# Patient Record
Sex: Male | Born: 1965 | Race: White | Hispanic: No | Marital: Married | Smoking: Never smoker
Health system: Southern US, Community
[De-identification: ages and names within clinical notes are randomized; demographics above are authoritative.]

## PROBLEM LIST (undated history)

## (undated) DIAGNOSIS — I5189 Other ill-defined heart diseases: Secondary | ICD-10-CM

## (undated) DIAGNOSIS — R079 Chest pain, unspecified: Secondary | ICD-10-CM

## (undated) DIAGNOSIS — M545 Low back pain, unspecified: Secondary | ICD-10-CM

## (undated) DIAGNOSIS — G9050 Complex regional pain syndrome I, unspecified: Secondary | ICD-10-CM

## (undated) DIAGNOSIS — E7800 Pure hypercholesterolemia, unspecified: Secondary | ICD-10-CM

## (undated) DIAGNOSIS — E119 Type 2 diabetes mellitus without complications: Secondary | ICD-10-CM

## (undated) DIAGNOSIS — K760 Fatty (change of) liver, not elsewhere classified: Secondary | ICD-10-CM

## (undated) DIAGNOSIS — R251 Tremor, unspecified: Secondary | ICD-10-CM

## (undated) DIAGNOSIS — E669 Obesity, unspecified: Secondary | ICD-10-CM

## (undated) DIAGNOSIS — I2510 Atherosclerotic heart disease of native coronary artery without angina pectoris: Secondary | ICD-10-CM

## (undated) DIAGNOSIS — I319 Disease of pericardium, unspecified: Secondary | ICD-10-CM

## (undated) DIAGNOSIS — I10 Essential (primary) hypertension: Secondary | ICD-10-CM

## (undated) DIAGNOSIS — R809 Proteinuria, unspecified: Secondary | ICD-10-CM

## (undated) DIAGNOSIS — G4733 Obstructive sleep apnea (adult) (pediatric): Secondary | ICD-10-CM

## (undated) DIAGNOSIS — G8929 Other chronic pain: Secondary | ICD-10-CM

## (undated) DIAGNOSIS — R748 Abnormal levels of other serum enzymes: Secondary | ICD-10-CM

## (undated) DIAGNOSIS — Z9689 Presence of other specified functional implants: Secondary | ICD-10-CM

## (undated) HISTORY — DX: Obstructive sleep apnea (adult) (pediatric): G47.33

## (undated) HISTORY — DX: Low back pain, unspecified: M54.50

## (undated) HISTORY — DX: Tremor, unspecified: R25.1

## (undated) HISTORY — PX: ANKLE SURGERY: SHX546

## (undated) HISTORY — PX: ANKLE ARTHROSCOPY: SHX545

## (undated) HISTORY — DX: Chest pain, unspecified: R07.9

## (undated) HISTORY — DX: Disease of pericardium, unspecified: I31.9

## (undated) HISTORY — PX: ELBOW SURGERY: SHX618

## (undated) HISTORY — DX: Abnormal levels of other serum enzymes: R74.8

## (undated) HISTORY — DX: Other ill-defined heart diseases: I51.89

## (undated) HISTORY — DX: Pure hypercholesterolemia, unspecified: E78.00

## (undated) HISTORY — DX: Fatty (change of) liver, not elsewhere classified: K76.0

## (undated) HISTORY — DX: Proteinuria, unspecified: R80.9

## (undated) HISTORY — DX: Low back pain: M54.5

## (undated) HISTORY — DX: Obesity, unspecified: E66.9

## (undated) HISTORY — PX: OTHER SURGICAL HISTORY: SHX169

## (undated) HISTORY — DX: Atherosclerotic heart disease of native coronary artery without angina pectoris: I25.10

## (undated) HISTORY — DX: Type 2 diabetes mellitus without complications: E11.9

---

## 1999-08-30 ENCOUNTER — Ambulatory Visit (HOSPITAL_COMMUNITY): Admission: RE | Admit: 1999-08-30 | Discharge: 1999-08-30 | Payer: Self-pay | Admitting: Urology

## 1999-08-30 ENCOUNTER — Encounter: Payer: Self-pay | Admitting: Urology

## 2001-05-30 ENCOUNTER — Encounter: Payer: Self-pay | Admitting: Emergency Medicine

## 2001-05-30 ENCOUNTER — Emergency Department (HOSPITAL_COMMUNITY): Admission: EM | Admit: 2001-05-30 | Discharge: 2001-05-30 | Payer: Self-pay | Admitting: Emergency Medicine

## 2005-05-09 ENCOUNTER — Encounter: Admission: RE | Admit: 2005-05-09 | Discharge: 2005-05-09 | Payer: Self-pay | Admitting: General Surgery

## 2005-08-20 ENCOUNTER — Encounter: Admission: RE | Admit: 2005-08-20 | Discharge: 2005-08-20 | Payer: Self-pay | Admitting: Family Medicine

## 2006-11-24 DIAGNOSIS — I2510 Atherosclerotic heart disease of native coronary artery without angina pectoris: Secondary | ICD-10-CM

## 2006-11-24 DIAGNOSIS — I319 Disease of pericardium, unspecified: Secondary | ICD-10-CM

## 2006-11-24 HISTORY — DX: Atherosclerotic heart disease of native coronary artery without angina pectoris: I25.10

## 2006-11-24 HISTORY — DX: Disease of pericardium, unspecified: I31.9

## 2007-03-02 ENCOUNTER — Encounter: Admission: RE | Admit: 2007-03-02 | Discharge: 2007-03-02 | Payer: Self-pay | Admitting: Family Medicine

## 2007-03-21 ENCOUNTER — Inpatient Hospital Stay (HOSPITAL_COMMUNITY): Admission: EM | Admit: 2007-03-21 | Discharge: 2007-03-23 | Payer: Self-pay | Admitting: *Deleted

## 2007-03-22 ENCOUNTER — Encounter (INDEPENDENT_AMBULATORY_CARE_PROVIDER_SITE_OTHER): Payer: Self-pay | Admitting: Cardiology

## 2007-03-24 ENCOUNTER — Inpatient Hospital Stay (HOSPITAL_COMMUNITY): Admission: EM | Admit: 2007-03-24 | Discharge: 2007-03-26 | Payer: Self-pay | Admitting: *Deleted

## 2007-03-31 ENCOUNTER — Emergency Department (HOSPITAL_COMMUNITY): Admission: EM | Admit: 2007-03-31 | Discharge: 2007-03-31 | Payer: Self-pay | Admitting: Emergency Medicine

## 2007-09-11 IMAGING — CR DG CHEST 2V
2 series · 2 of 2 positions shown · non-contrast
Comparison: 03/21/07.

CLINICAL DATA: Chest pain.   Diabetes. 
 CHEST - 2 VIEW:

[w chest pa]
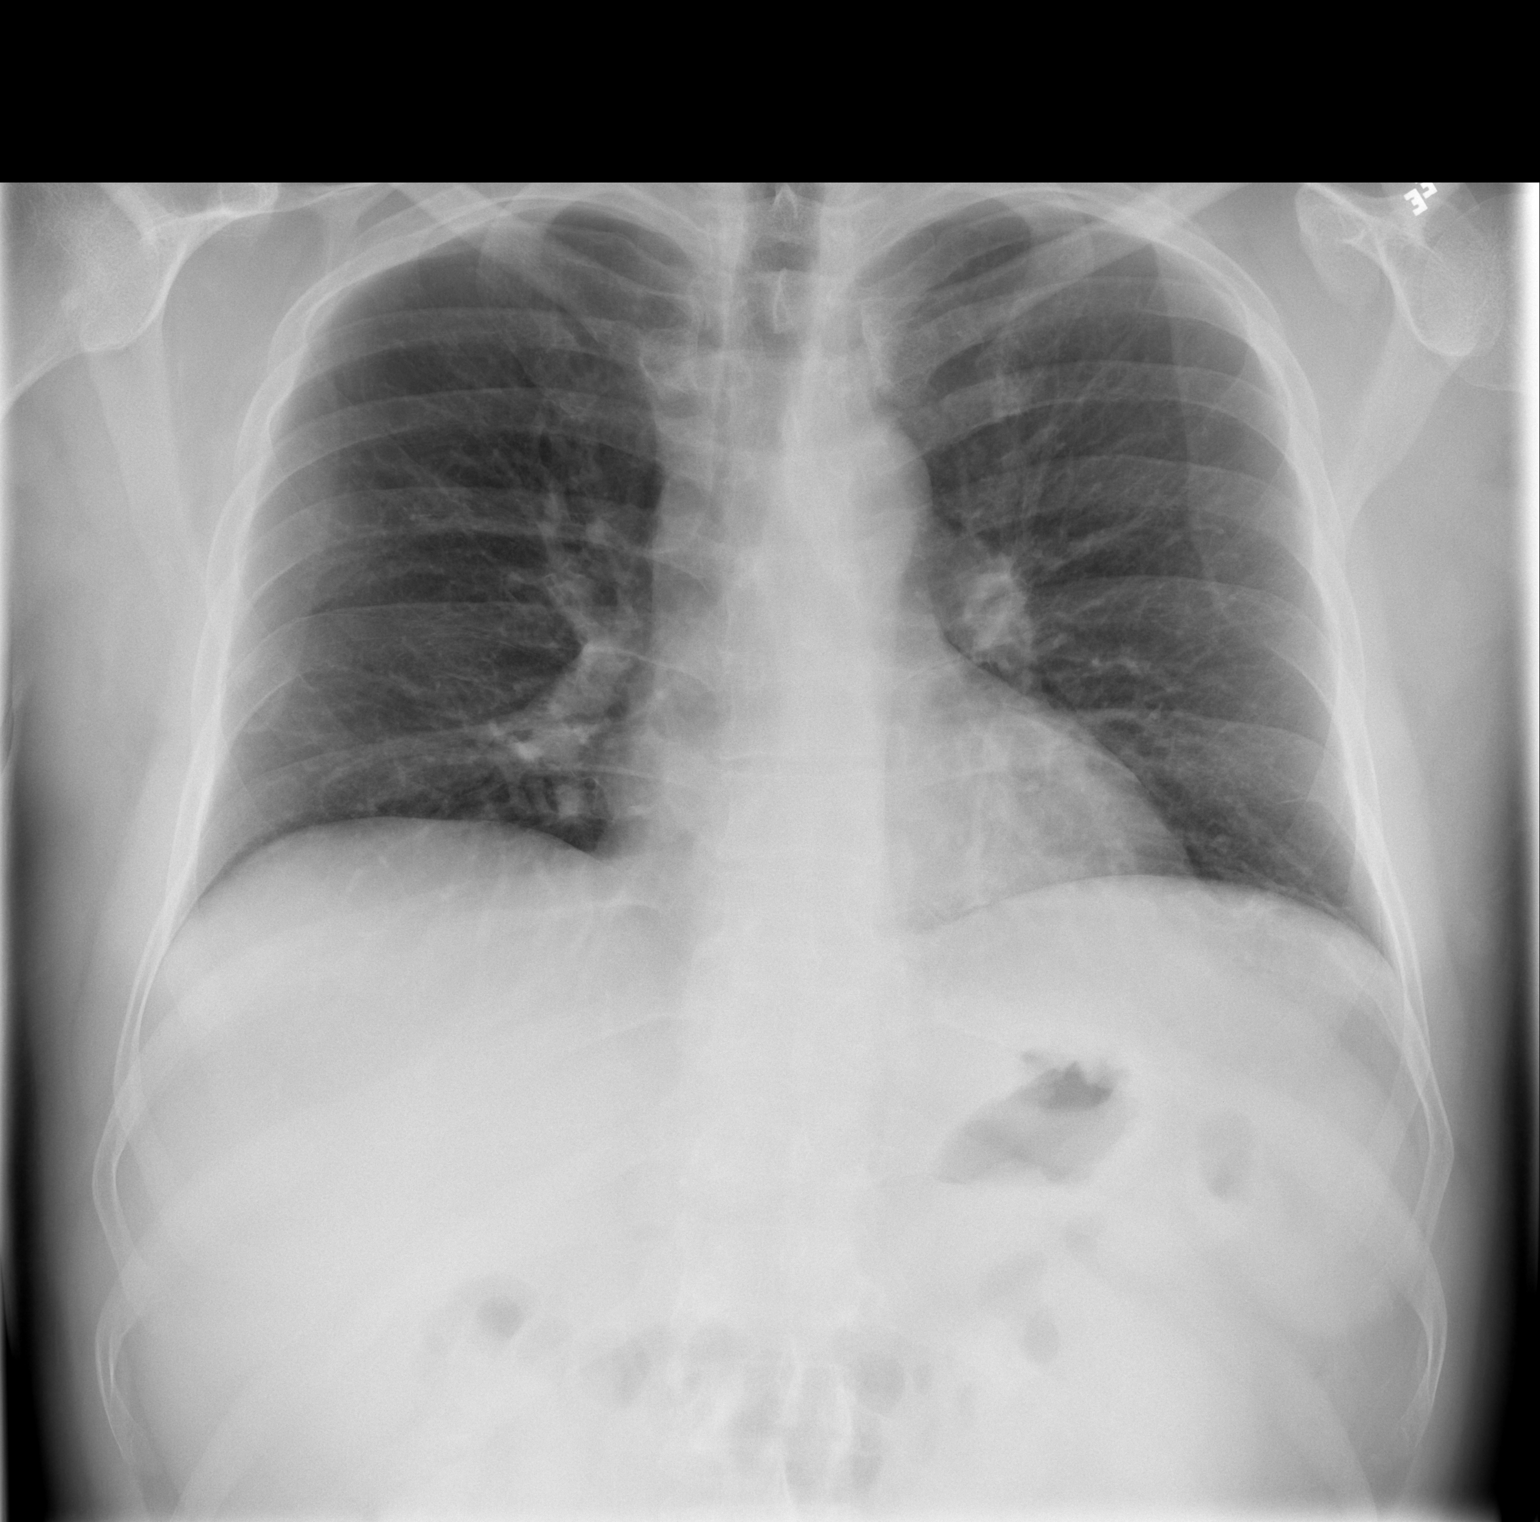

[w chest lat *]
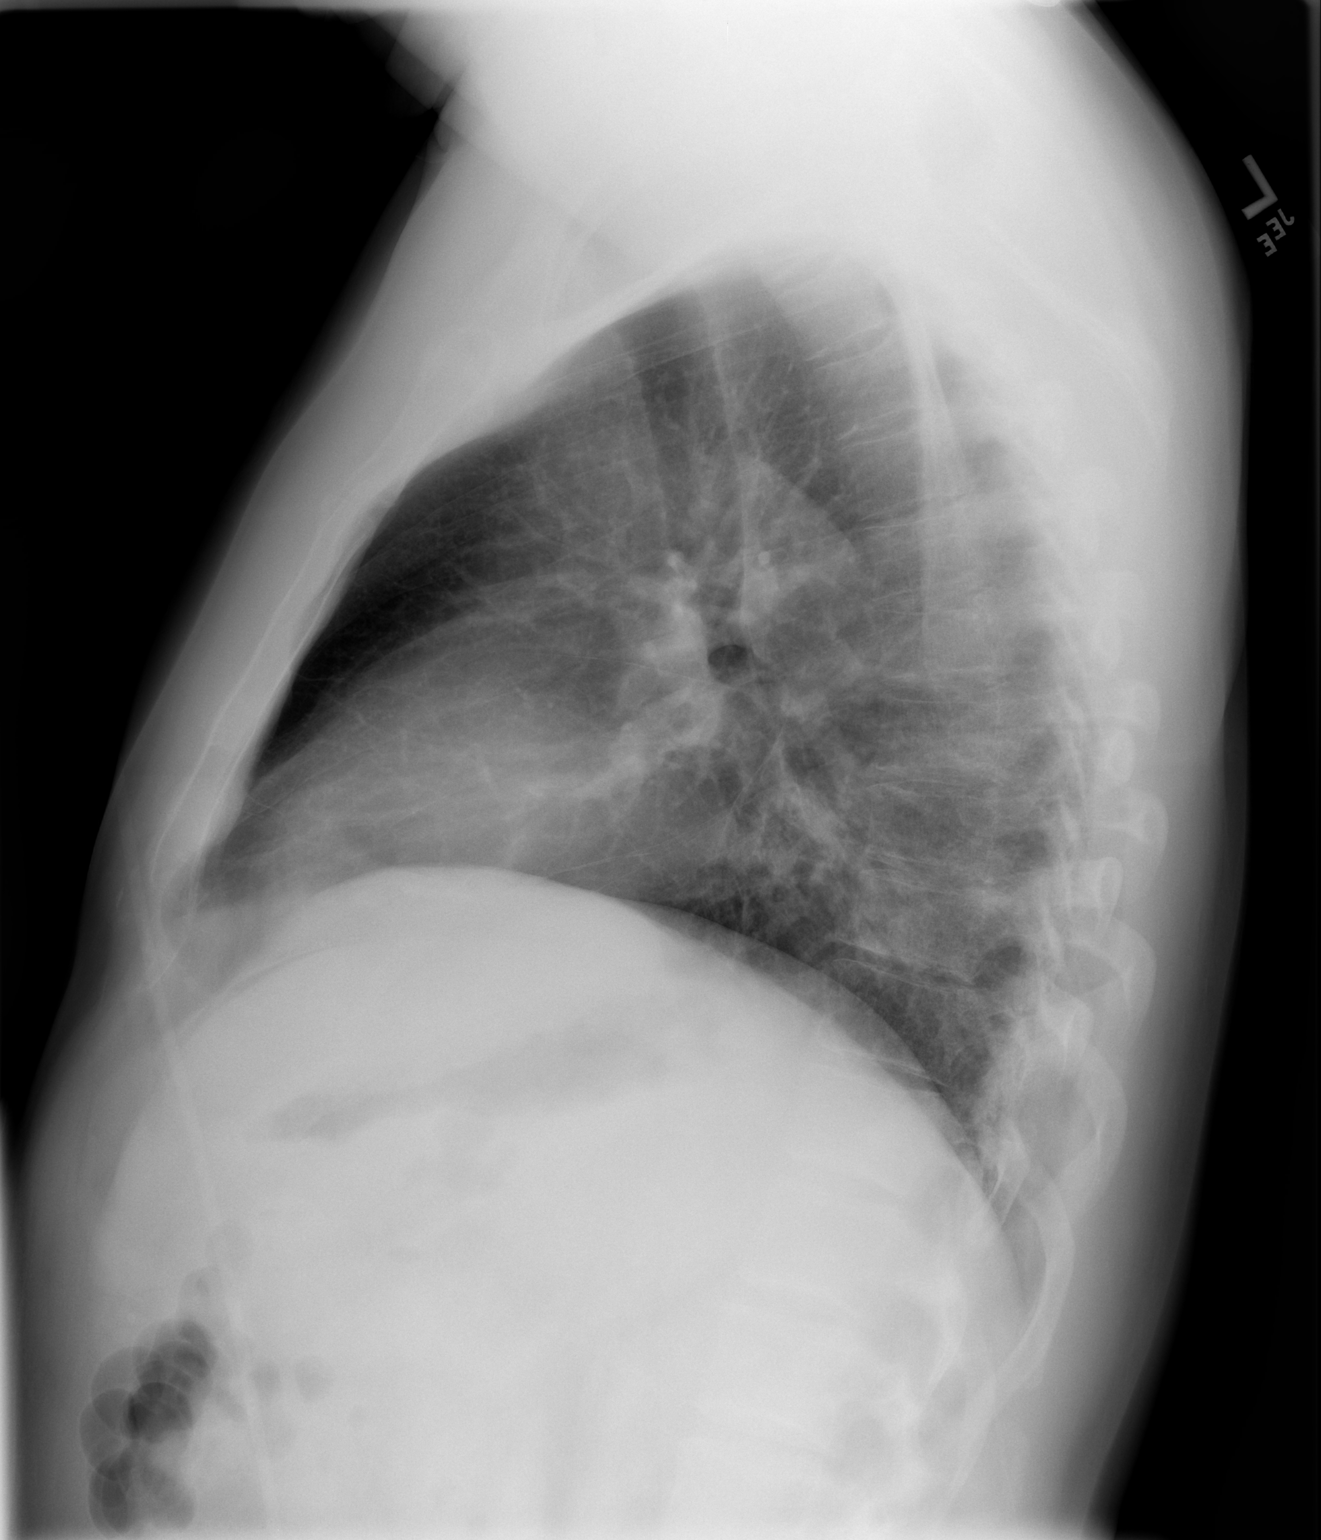

[2 of 2 positions shown; findings below may reference images not displayed]

FINDINGS: Two views of the chest show the lung to be clear.  The heart is within normal limits in size.  No bony abnormality is seen.
IMPRESSION: No active lung disease.

## 2010-11-15 ENCOUNTER — Encounter
Admission: RE | Admit: 2010-11-15 | Discharge: 2010-11-15 | Payer: Self-pay | Source: Home / Self Care | Attending: Family Medicine | Admitting: Family Medicine

## 2011-04-11 NOTE — H&P (Signed)
NAME:  INRI, SOBIESKI NO.:  [id number]   MEDICAL RECORD NO.:  [id number]          PATIENT TYPE:  INP   LOCATION:  3703                         FACILITY:  [facility]   PHYSICIAN:  Guy Franco, P.A.       DATE OF BIRTH:  1966-05-27   DATE OF ADMISSION:  03/21/2007  DATE OF DISCHARGE:  03/23/2007                              HISTORY & PHYSICAL   DATE OF ADMISSION:  March 24, 2007.   REASON FOR ADMISSION:  Chest pain.   Mr. Rajewski is a 45 year old male patient, who was discharged from the  hospital yesterday with chest pain.  His cardiac isoenzymes were  negative, and he was scheduled for a Cardiolite tomorrow at our office.  Over the past three months, he has noted intermittent chest pain, and  over the past three weeks it has worsened.  On Sunday, his pain became  unbearable and he went to the emergency room.  During his  hospitalization, enzymes were negative, and he also had a two-D echo  that was normal.  He was sent home yesterday and then overnight he began  having diaphoretic spells, nausea, vomiting, and chest pain with deep  breathing.  He states that the chest pain is always present but worse  with deep breathing.  Please note on his two-D echo that his pericardium  was normal and there was no evidence of effusion.   PAST MEDICAL HISTORY:  1. Hypertension.  2. Hyperlipidemia.  3. Long term medication use.   SOCIAL HISTORY:  No tobacco, alcohol, or illicit drug use.  He is  married.   FAMILY HISTORY:  Dad had hypertension and an MI and died in his 71s.   CURRENT MEDICATIONS:  1. Cardizem 120 mg a day.  2. Baby aspirin daily.  3. Protonix 40 mg a day.  4. Zocor 20 mg a day.   ALLERGIES:  No known drug allergies.   PHYSICAL EXAMINATION:  VITAL SIGNS:  Weight 206.6, height 5 foot 9  inches, pulse 92, blood pressure 158/92.  HEENT:  Grossly normal, normal carotid upstroke, no carotid bruits, no  JVD or thyromegaly, sclerae are clear, conjunctivae are  normal, nares  without drainage.  CHEST:  Clear to auscultation bilaterally, no wheezing or rhonchi.  HEART:  Regular rate and rhythm, no gross murmur, there is no rub, there  are no crackles, there is no evidence of pericardial effusion.  ABDOMEN:  Soft, nontender, nondistended, and no masses.  LOWER EXTREMITIES:  No peripheral edema.  SKIN:  Warm and dry.  NEURO:  Cranial nerves II-XII grossly intact.  He does appear to be  sick, pale.   REVIEW OF SYSTEMS:  As above.  Please note that he also had an episode  while he was sitting on the bed today.  He was getting ready to stand up  and he said he passed out for a couple of seconds; it was not witnessed.   ASSESSMENT AND PLAN:  1. Chest pain.  2. Questionable syncopal spell.  3. Hypertension.  4. Hyperlipidemia.  5. Long term medication use.   The patient was seen  and examined by Dr. Verdis Prime.  He will need to  have a coronary CT angiogram today.  We are giving him Lopressor to slow  down his heart rate, and because of his chest pain worsening with deep  breathing, we are generally concerned with a pericarditis.  We are  giving him some Decadron when he arrives at the emergency room and then  we will repeat it again in four hours.  He will remain NPO in the event  that he does need to go to the cardiac catheterization lab following the  CT angio.      Guy Franco, P.A.     LB/MEDQ  D:  03/24/2007  T:  03/24/2007  Job:  [id number]

## 2011-04-11 NOTE — Discharge Summary (Signed)
NAME:  Vincent Vargas, Vincent Vargas NO.:  [id number]   MEDICAL RECORD NO.:  [id number]          PATIENT TYPE:  INP   LOCATION:  3743                         FACILITY:  [facility]   PHYSICIAN:  Armanda Magic, M.D.     DATE OF BIRTH:  October 30, 1966   DATE OF ADMISSION:  03/24/2007  DATE OF DISCHARGE:                               DISCHARGE SUMMARY   Cardiologist:  Armanda Magic, M.D.   REASON FOR ADMISSION:  Chest pain.   DISCHARGE DIAGNOSES:  1. Chest pain, resolved.  Myocardial infarction ruled out with      negative cardiac enzymes x3.  2. Hypertension.  3. Dyslipidemia.  4. Acid reflux, newly diagnosed during a recent hospital admission on      March 22, 2007.   CONSULTATIONS:  None.   PROCEDURES:  March 24, 2007, coronary CT angiography revealing normal LV  size and function as well as normal coronary arteries.  No significant  pericardial effusion.  No extracardiac abnormalities noted.  The  suspicion was serositis or possibly pericarditis as the patient had  already shown improvement on IV Decadron.   HOSPITAL COURSE:  Vincent Vargas is a 45 year old Caucasian male who was  recently discharged from the hospital on March 23, 2007, secondary to  chest pains.  MI was ruled out with negative serial cardiac enzymes x3.  The patient's chest pain was believed to be secondary to GERD and he was  started on Protonix 40 mg daily upon discharge as well as aspirin 81 mg  daily.  He was scheduled for an outpatient stress Cardiolite at the  [facility] office in [place], [place], on Mar 25, 2007,  however was readmitted to the hospital on March 24, 2007, with a chief  complaint of chest pain.  A 12-lead EKG revealed normal sinus rhythm  with an incomplete right bundle branch block with a ventricular rate of  87 beats per minute.  There was no evidence of acute ischemic changes.  Serial cardiac enzymes were negative x3 with a peak troponin of 0.03,  BNP was within normal  limits as well as TSH.  Coronary CT angiography  revealed normal coronary arteries with normal LV size and systolic  function.  The patient was suspected to have serositis or pericarditis  as he had already shown improvement with IV Decadron x1.  The patient  continued to complain of chest pain during the admission that was  relived with IV morphine.  Liver function tests were minimally elevated,  so an amylase and lipase was checked and were within normal limits x2.  The patient's Protonix was increased to 40 mg twice daily during this  admission.  Also during this admission the patient was started on  Lopressor 25 mg twice daily.  He was then prescribed ibuprofen 800 mg  every 6 hours secondary to possible costochondritis.  On Mar 26, 2007, he  was given IV Toradol 30 mg every 8 hours as needed.  IV morphine and IV  nitroglycerin were discontinued.  IV saline was locked.  The patient  continued to complain of chest pain and requested a cardiac  catheterization.  However, due to the fact that the patient had normal  coronary arteries via CT coronary angiography and the increased risk of  catheterization, Dr. Armanda Magic did not see a need for proceeding with  a cardiac catheterization so the patient requested to be transferred to  [facility] for further evaluation and possible left heart  catheterization.  He is being discharged to [facility] in stable  condition.  CT coronary angiography was negative for PE or dissection as  well.  He was seen and examined by Dr. Armanda Magic on today, who was in  agreement with the discharge decision.  A 12-lead EKG on today revealed  sinus bradycardia with a ventricular rate of 58 beats per minute with an  incomplete right bundle branch block.  There is no evidence of acute  ischemic changes.   LABORATORY DATA:  Sodium 135, potassium 4.3, chloride 102, CO2 28,  glucose 124, BUN 15, creatinine 0.95.  Total bilirubin 0.6,k alkaline   phosphatase 71, AST 23, ALT 58, total protein 6.7, serum albumin 3.7,  calcium 9.3.  Amylase 54 and 40, respectively.  Lipase 30 and 25,  respectively.  Serial cardiac enzymes:  CK total 164, 100, and 92.  CK-  MB 1.6, 1.2, and 1.2.  Troponin I 0.03 and 0.02 x2.  BNP less than 30.0.  TSH 2.590.  Antinuclear antibody negative.  White blood count 6.8,  hemoglobin 14.7, hematocrit 42.9, platelets 268,000.   X-RAYS:  None during this admission.  CT coronary angiography revealed  no acute findings.  Basically normal coronary arteries.  Soft,  nonobstructive plaque is noted in the mid right coronary.  Normal LV  function with borderline LV hypertrophy.  Extracardiac findings were  interpreted by Vincent Vargas, M.D., and revealed no acute findings.  March 24, 2007.   EKGs as stated in hospital course.   CONDITION ON DISCHARGE:  Stable.   DISCHARGE MEDICATIONS:  1. Aspirin 325 mg daily.  2. Zocor 20 mg daily.  3. Lopressor 25 mg twice daily.  4. Ibuprofen 800 mg four times daily for 7 days.  5. Protonix 40 mg twice daily.   DISCHARGE INSTRUCTIONS:  1. Discontinue diltiazem 240 mg daily.  2. Continue a low-fat, low-cholesterol, low-sodium diet.   FOLLOW-UP ARRANGEMENTS:  Follow up at [facility] as needed.  The  patient is being transferred to [facility] via [facility] in stable  condition.  The patient has been explained the increased risks of  cardiac catheterization in a patient with normal coronary arteries.  However, he admits to understanding of the information but still wishes  to proceed.   Discharge to [facility].      Tylene Fantasia, [place]      Armanda Magic, M.D.  Electronically Signed    RDM/MEDQ  D:  03/26/2007  T:  03/26/2007  Job:  [id number]

## 2011-04-11 NOTE — H&P (Signed)
NAME:  Vincent Vargas, Vincent Vargas             ACCOUNT NO.:  [id number]   MEDICAL RECORD NO.:  [id number]          PATIENT TYPE:  INP   LOCATION:  3703                         FACILITY:  [facility]   PHYSICIAN:  Kela Millin, M.D.DATE OF BIRTH:  October 22, 1966   DATE OF ADMISSION:  03/21/2007  DATE OF DISCHARGE:                              HISTORY & PHYSICAL   PRIMARY CARE PHYSICIAN:  Dr. Donia Guiles.   CHIEF COMPLAINT:  Chest pain.   HISTORY OF PRESENT ILLNESS:  The patient is a 45 year old white male  with past medical history significant for hypertension and  hyperlipidemia (on no medications) who presents with complaints of chest  pain over several weeks with worsening x1 day.  He describes the pain as  a pressure, 8/10 in intensity, nonexertional, midsternal in location,  and sometimes worse with inspiration.  He admits to nausea but no  vomiting, some shortness of breath.  He denies diaphoresis, also denies  radiation.  In the ER, he was given nitroglycerin but he states he did  not have much relief with that.  He denies cough, fevers, hematemesis,  abdominal pain, dysuria, melena, diarrhea, and no hematochezia.   In the ER, he had point-of-care markers that are negative and also his  EKG with no ischemic changes.  He is admitted to the [facility]  service for further evaluation and management.   PAST MEDICAL HISTORY:  As stated above.   MEDICATIONS:  Diltiazem 120 mg daily.   ALLERGIES:  NKDA.   SOCIAL HISTORY:  He denies tobacco.  He also denies alcohol.   FAMILY HISTORY:  His dad had hypertension and died in his 4s of an MI.   REVIEW OF SYSTEMS:  As per HPI, other review of systems negative.   PHYSICAL EXAMINATION:  GENERAL:  The patient is a middle-aged white  male.  He is alert and oriented in no apparent distress.  VITAL SIGNS:  His temperature is 98.1 with a blood pressure of 123/94,  pulse of 63, respiratory rate of 16, O2 saturation of 98%.  HEENT:  PERRL,  EOMI, sclerae are anicteric, moist mucous membranes, no  oral exudates.  NECK:  Supple, no adenopathy, no thyromegaly and no JVD.  LUNGS:  Clear to auscultation bilaterally, no crackles or wheezes.  CARDIOVASCULAR:  Regular rate and rhythm, normal S1, S2.  ABDOMEN:  Soft, bowel sounds present, nontender, nondistended, no  organomegaly and no masses palpable.  EXTREMITIES:  No cyanosis and no edema.   LABORATORY DATA:  Point-of-care markers negative.  His sodium is 138,  potassium 4.0, chloride 106, BUN 9, glucose 107, pH is 7.40, his bicarb  is 25.7 and his white cell count 5.9 with a hemoglobin of 14.3,  hematocrit of 41.7, platelet count 281, neutrophil count 50%.  His  creatinine is 0.9.   ASSESSMENT AND PLAN:  Problem 1. CHEST PAIN.  We will obtain serial  cardiac enzymes and EKG also obtained and fasting lipid profile.  We  will place the patient on aspirin and nitroglycerin.  We will also place  the patient on Protonix to cover for possible gastrointestinal etiology.  Per patient report, he is scheduled for a stress test at 9:30 in the  morning per Dr. Amil Amen, we will call and consult, also notify of the  patient's admission.   Problem 2. HYPERTENSION.  Continue outpatient medications.   Problem 3. QUESTION HYPERLIPIDEMIA.  We will obtain a fasting lipid  profile.      Kela Millin, M.D.  Electronically Signed     ACV/MEDQ  D:  03/22/2007  T:  03/22/2007  Job:  [id number]   cc:   Donia Guiles, M.D.  Francisca December, M.D.

## 2011-08-14 ENCOUNTER — Ambulatory Visit (HOSPITAL_COMMUNITY)
Admission: RE | Admit: 2011-08-14 | Discharge: 2011-08-14 | Disposition: A | Discharge status: Home / Self Care | Payer: 59 | Source: Ambulatory Visit | Attending: Gastroenterology | Admitting: Gastroenterology

## 2011-08-14 ENCOUNTER — Other Ambulatory Visit: Payer: Self-pay | Admitting: Gastroenterology

## 2011-08-14 DIAGNOSIS — K920 Hematemesis: Secondary | ICD-10-CM | POA: Insufficient documentation

## 2011-08-14 DIAGNOSIS — E119 Type 2 diabetes mellitus without complications: Secondary | ICD-10-CM | POA: Insufficient documentation

## 2011-08-14 DIAGNOSIS — K2940 Chronic atrophic gastritis without bleeding: Secondary | ICD-10-CM | POA: Insufficient documentation

## 2011-08-14 DIAGNOSIS — R7401 Elevation of levels of liver transaminase levels: Secondary | ICD-10-CM | POA: Insufficient documentation

## 2011-08-14 DIAGNOSIS — R7402 Elevation of levels of lactic acid dehydrogenase (LDH): Secondary | ICD-10-CM | POA: Insufficient documentation

## 2011-08-14 DIAGNOSIS — K7689 Other specified diseases of liver: Secondary | ICD-10-CM | POA: Insufficient documentation

## 2011-08-24 NOTE — Op Note (Signed)
  NAME:  [name], [name] NO.:  [id number]  MEDICAL RECORD NO.:  [id number]  LOCATION:  WLEN                         FACILITY:  [facility]  PHYSICIAN:  Danise Edge, M.D.   DATE OF BIRTH:  [birth year]  DATE OF PROCEDURE:  08/14/2011 DATE OF DISCHARGE:                              OPERATIVE REPORT   REFERRING PHYSICIAN:  Lupita Raider, M.D.  PROCEDURE:  Diagnostic esophagogastroduodenoscopy.  HISTORY:  Mr. Vincent Vargas is a 45 year old male, born on November 20, 1966.  The patient has unexplained nausea with intermittent hematemesis.  He denies abdominal pain, history of peptic ulcer disease, use of nonsteroidal anti-inflammatory medication, heartburn, indigestion, dysphagia, or odynophagia.  He does have type 2 diabetes mellitus and mildly elevated liver transaminases associated with fatty liver by abdominal ultrasound.  ENDOSCOPIST:  Danise Edge, M.D.  PREMEDICATION: 1. Fentanyl 75 mcg. 2. Versed 5 mg.  PROCEDURE IN DETAIL:  The patient was placed in the left lateral decubitus position.  The Pentax gastroscope was passed through the posterior hypopharynx into the proximal esophagus without difficulty. The hypopharynx, larynx, and vocal cords appeared normal.  Esophagoscopy:  The proximal mid and lower segments of the esophageal mucosa appeared normal.  The squamocolumnar junction was noted at 40 cm from the incisor teeth.  There was no endoscopic evidence for the presence of erosive esophagitis or Barrett esophagus.  Gastroscopy:  Retroflex view of the gastric, cardia, and fundus was normal.  The gastric body, antrum, and pylorus appeared normal.  Duodenoscopy:  The duodenal bulb and descending duodenum appeared normal.  Biopsies:  Multiple biopsies were taken from the stomach to rule out H. pylori gastritis.  ASSESSMENT:  Normal esophagogastroduodenoscopy with gastric biopsies to look for Helicobacter pylori gastritis pending.  The  patient's esophagogastroduodenoscopy was normal.  I suspect his episodes of nausea are associated with his diabetes and possible diabetic gastroparesis.          ______________________________ Danise Edge, M.D.     MJ/MEDQ  D:  08/14/2011  T:  08/14/2011  Job:  [id number]  cc:   Lupita Raider, M.D. Fax: [phone]  Electronically Signed by Danise Edge M.D. on 08/24/2011 01:44:18 PM

## 2012-03-12 ENCOUNTER — Emergency Department (HOSPITAL_BASED_OUTPATIENT_CLINIC_OR_DEPARTMENT_OTHER)
Admission: EM | Admit: 2012-03-12 | Discharge: 2012-03-12 | Disposition: A | Discharge status: Home / Self Care | Payer: 59 | Attending: Emergency Medicine | Admitting: Emergency Medicine

## 2012-03-12 ENCOUNTER — Emergency Department (INDEPENDENT_AMBULATORY_CARE_PROVIDER_SITE_OTHER): Payer: 59

## 2012-03-12 ENCOUNTER — Encounter (HOSPITAL_BASED_OUTPATIENT_CLINIC_OR_DEPARTMENT_OTHER): Payer: Self-pay | Admitting: *Deleted

## 2012-03-12 DIAGNOSIS — Z87442 Personal history of urinary calculi: Secondary | ICD-10-CM | POA: Insufficient documentation

## 2012-03-12 DIAGNOSIS — K7689 Other specified diseases of liver: Secondary | ICD-10-CM

## 2012-03-12 DIAGNOSIS — N201 Calculus of ureter: Secondary | ICD-10-CM

## 2012-03-12 DIAGNOSIS — I708 Atherosclerosis of other arteries: Secondary | ICD-10-CM

## 2012-03-12 DIAGNOSIS — R1032 Left lower quadrant pain: Secondary | ICD-10-CM | POA: Insufficient documentation

## 2012-03-12 DIAGNOSIS — N1330 Unspecified hydronephrosis: Secondary | ICD-10-CM

## 2012-03-12 DIAGNOSIS — I10 Essential (primary) hypertension: Secondary | ICD-10-CM | POA: Insufficient documentation

## 2012-03-12 DIAGNOSIS — N23 Unspecified renal colic: Secondary | ICD-10-CM

## 2012-03-12 DIAGNOSIS — R112 Nausea with vomiting, unspecified: Secondary | ICD-10-CM | POA: Insufficient documentation

## 2012-03-12 HISTORY — DX: Essential (primary) hypertension: I10

## 2012-03-12 LAB — URINALYSIS, ROUTINE W REFLEX MICROSCOPIC
Glucose, UA: 500 mg/dL — AB
Leukocytes, UA: NEGATIVE
pH: 5 (ref 5.0–8.0)

## 2012-03-12 LAB — URINE MICROSCOPIC-ADD ON

## 2012-03-12 MED ORDER — HYDROMORPHONE HCL 2 MG PO TABS: 2.0000 mg | ORAL_TABLET | ORAL | Status: AC | PRN

## 2012-03-12 MED ORDER — KETOROLAC TROMETHAMINE 15 MG/ML IJ SOLN
15.0000 mg | Freq: Once | INTRAMUSCULAR | Status: AC
  Administered 2012-03-12: 15 mg via INTRAVENOUS
  Filled 2012-03-12: qty 1

## 2012-03-12 MED ORDER — ONDANSETRON 8 MG PO TBDP: 8.0000 mg | ORAL_TABLET | Freq: Three times a day (TID) | ORAL | Status: AC | PRN

## 2012-03-12 MED ORDER — HYDROMORPHONE HCL PF 1 MG/ML IJ SOLN
1.0000 mg | Freq: Once | INTRAMUSCULAR | Status: AC
  Administered 2012-03-12: 1 mg via INTRAVENOUS

## 2012-03-12 MED ORDER — NAPROXEN SODIUM 220 MG PO TABS: ORAL_TABLET | ORAL | Status: DC

## 2012-03-12 MED ORDER — ONDANSETRON HCL 4 MG/2ML IJ SOLN
4.0000 mg | Freq: Once | INTRAMUSCULAR | Status: AC
  Administered 2012-03-12: 4 mg via INTRAVENOUS

## 2012-03-12 MED ORDER — HYDROMORPHONE HCL PF 1 MG/ML IJ SOLN
INTRAMUSCULAR | Status: AC
  Administered 2012-03-12: 1 mg via INTRAVENOUS
  Filled 2012-03-12: qty 1

## 2012-03-12 MED ORDER — HYDROMORPHONE HCL PF 1 MG/ML IJ SOLN
INTRAMUSCULAR | Status: AC
  Filled 2012-03-12: qty 1

## 2012-03-12 MED ORDER — ONDANSETRON HCL 4 MG/2ML IJ SOLN
INTRAMUSCULAR | Status: AC
  Filled 2012-03-12: qty 2

## 2012-03-12 MED ORDER — SODIUM CHLORIDE 0.9 % IV SOLN: INTRAVENOUS | Status: DC

## 2012-03-12 NOTE — Discharge Instructions (Signed)
Ureteral Colic (Kidney Stones) Ureteral colic is the result of a condition when kidney stones form inside the kidney. Once kidney stones are formed they may move into the tube that connects the kidney with the bladder (ureter). If this occurs, this condition may cause pain (colic) in the ureter.  CAUSES  Pain is caused by stone movement in the ureter and the obstruction caused by the stone. SYMPTOMS  The pain comes and goes as the ureter contracts around the stone. The pain is usually intense, sharp, and stabbing in character. The location of the pain may move as the stone moves through the ureter. When the stone is near the kidney the pain is usually located in the back and radiates to the belly (abdomen). When the stone is ready to pass into the bladder the pain is often located in the lower abdomen on the side the stone is located. At this location, the symptoms may mimic those of a urinary tract infection with urinary frequency. Once the stone is located here it often passes into the bladder and the pain disappears completely. TREATMENT   Your caregiver will provide you with medicine for pain relief.   You may require specialized follow-up X-rays.   The absence of pain does not always mean that the stone has passed. It may have just stopped moving. If the urine remains completely obstructed, it can cause loss of kidney function or even complete destruction of the involved kidney. It is your responsibility and in your interest that X-rays and follow-ups as suggested by your caregiver are completed. Relief of pain without passage of the stone can be associated with severe damage to the kidney, including loss of kidney function on that side.   If your stone does not pass on its own, additional measures may be taken by your caregiver to ensure its removal.  HOME CARE INSTRUCTIONS   Increase your fluid intake. Water is the preferred fluid since juices containing vitamin C may acidify the urine making  it less likely for certain stones (uric acid stones) to pass.   Strain all urine. A strainer will be provided. Keep all particulate matter or stones for your caregiver to inspect.   Take your pain medicine as directed.   Make a follow-up appointment with your caregiver as directed.   Remember that the goal is passage of your stone. The absence of pain does not mean the stone is gone. Follow your caregiver's instructions.   Only take over-the-counter or prescription medicines for pain, discomfort, or fever as directed by your caregiver.  SEEK MEDICAL CARE IF:   Pain cannot be controlled with the prescribed medicine.   You have a fever.   Pain continues for longer than your caregiver advises it should.   There is a change in the pain, and you develop chest discomfort or constant abdominal pain.   You feel faint or pass out.  MAKE SURE YOU:   Understand these instructions.   Will watch your condition.   Will get help right away if you are not doing well or get worse.  Document Released: 08/20/2005 Document Revised: 10/30/2011 Document Reviewed: 05/07/2011 [employer] Patient Information 2012 [employer]. 

## 2012-03-12 NOTE — ED Notes (Signed)
Pt with left flank pain and nausea sx began this am has a hx of kidney stones and sx are similar to previous stone

## 2012-03-12 NOTE — ED Provider Notes (Signed)
History     CSN: [id number]  Arrival date & time 03/12/12  0358   First MD Initiated Contact with Patient 03/12/12 0405      Chief Complaint  Patient presents with  . Flank Pain    (Consider location/radiation/quality/duration/timing/severity/associated sxs/prior treatment) HPI This is a 46 year old white male with a history of kidney stones. He awoke about 2 hours ago with severe left lower quadrant pain. It is characterized as similar to prior kidney stone. It is accompanied by nausea and vomiting. He has had no dysuria, hematuria or diarrhea. He has had no fevers or chills.  Past Medical History  Diagnosis Date  . Hypertension     History reviewed. No pertinent past surgical history.  History reviewed. No pertinent family history.  History  Substance Use Topics  . Smoking status: Never Smoker   . Smokeless tobacco: Not on file  . Alcohol Use: No      Review of Systems  All other systems reviewed and are negative.    Allergies  Review of patient's allergies indicates no known allergies.  Home Medications   Current Outpatient Rx  Name Route Sig Dispense Refill  . UNKNOWN TO PATIENT        BP 152/86  Pulse 76  Temp(Src) 98.1 F (36.7 C) (Oral)  Resp 16  SpO2 98%  Physical Exam General: Well-developed, well-nourished male in no acute distress; appearance consistent with age of record; appears uncomfortable HENT: normocephalic, atraumatic Eyes: pupils equal round and reactive to light; extraocular muscles intact Neck: supple Heart: regular rate and rhythm Lungs: clear to auscultation bilaterally Abdomen: soft; nondistended; mild left lower quadrant tenderness; bowel sounds present GU: No flank tenderness Extremities: No deformity; full range of motion Neurologic: Awake, alert and oriented; motor function intact in all extremities and symmetric; no facial droop Skin: Warm and dry Psychiatric: Anxious    ED Course  Procedures (including critical  care time)    MDM   Nursing notes and vitals signs, including pulse oximetry, reviewed.  Summary of this visit's results, reviewed by myself:  Labs:  Results for orders placed during the hospital encounter of 03/12/12  URINALYSIS, ROUTINE W REFLEX MICROSCOPIC      Component Value Range   Color, Urine AMBER (*) YELLOW    APPearance CLOUDY (*) CLEAR    Specific Gravity, Urine 1.026  1.005 - 1.030    pH 5.0  5.0 - 8.0    Glucose, UA 500 (*) NEGATIVE (mg/dL)   Hgb urine dipstick SMALL (*) NEGATIVE    Bilirubin Urine NEGATIVE  NEGATIVE    Ketones, ur 15 (*) NEGATIVE (mg/dL)   Protein, ur 30 (*) NEGATIVE (mg/dL)   Urobilinogen, UA 0.2  0.0 - 1.0 (mg/dL)   Nitrite NEGATIVE  NEGATIVE    Leukocytes, UA NEGATIVE  NEGATIVE   URINE MICROSCOPIC-ADD ON      Component Value Range   Squamous Epithelial / LPF RARE  RARE    WBC, UA 0-2  <3 (WBC/hpf)   RBC / HPF 3-6  <3 (RBC/hpf)   Urine-Other MUCOUS PRESENT      Imaging Studies: Ct Abdomen Pelvis Wo Contrast  03/12/2012  *RADIOLOGY REPORT*  Clinical Data: Left flank pain.  History of renal stone.  CT ABDOMEN AND PELVIS WITHOUT CONTRAST  Technique:  Multidetector CT imaging of the abdomen and pelvis was performed following the standard protocol without intravenous contrast.  Comparison: Abdominal ultrasound performed 11/15/2010  Findings: The visualized lung bases are clear.  Diffuse fatty infiltration is  noted within the liver.  An 8 mm hypodensity at the inferior tip of the liver is nonspecific but may reflect a small cyst.  The liver is otherwise grossly unremarkable in appearance.  The gallbladder is within normal limits.  The pancreas and adrenal glands are unremarkable.  There is minimal left-sided hydronephrosis, with prominence of the left ureter and mild surrounding soft tissue stranding, to the level of a distal obstructing 3 mm stone at the left vesicoureteral junction.  The right kidney is unremarkable in appearance.  No nonobstructing  renal stones are identified.  No free fluid is identified.  The small bowel is unremarkable in appearance.  The stomach is within normal limits.  No acute vascular abnormalities are seen.  Minimal calcification is noted along the right common iliac artery.  The appendix is normal in caliber and contains air, without evidence for appendicitis.  Minimal diverticulosis is noted along the proximal sigmoid colon, without evidence of diverticulitis. The colon is otherwise unremarkable in appearance.  The bladder is decompressed and otherwise grossly unremarkable in appearance.  The prostate is normal in size.  No inguinal lymphadenopathy is seen.  No acute osseous abnormalities are identified.  Chronic bilateral pars defects are seen at L5, without evidence of significant anterolisthesis.  IMPRESSION:  1.  Minimal left-sided hydronephrosis, with an obstructing distal 3 mm stone noted, at the left vesicoureteral junction. 2.  Diffuse fatty infiltration within the liver; question of tiny hepatic cyst. 3.  Minimal calcification along the right common iliac artery. 4.  Chronic bilateral pars defects at L5, without evidence of anterolisthesis.  Original Report Authenticated By: Tonia Ghent, M.D.            Hanley Seamen, MD 03/12/12 [phone]

## 2012-09-03 ENCOUNTER — Other Ambulatory Visit: Payer: Self-pay | Admitting: Gastroenterology

## 2012-09-09 ENCOUNTER — Ambulatory Visit
Admission: RE | Admit: 2012-09-09 | Discharge: 2012-09-09 | Disposition: A | Discharge status: Home / Self Care | Payer: 59 | Source: Ambulatory Visit | Attending: Gastroenterology | Admitting: Gastroenterology

## 2013-10-07 ENCOUNTER — Ambulatory Visit: Payer: 59 | Admitting: Cardiology

## 2013-10-09 ENCOUNTER — Encounter: Payer: Self-pay | Admitting: Cardiology

## 2013-10-09 ENCOUNTER — Encounter: Payer: Self-pay | Admitting: *Deleted

## 2013-10-09 DIAGNOSIS — E669 Obesity, unspecified: Secondary | ICD-10-CM | POA: Insufficient documentation

## 2013-10-09 DIAGNOSIS — R748 Abnormal levels of other serum enzymes: Secondary | ICD-10-CM | POA: Insufficient documentation

## 2013-10-09 DIAGNOSIS — M545 Low back pain: Secondary | ICD-10-CM | POA: Insufficient documentation

## 2013-10-09 DIAGNOSIS — R079 Chest pain, unspecified: Secondary | ICD-10-CM | POA: Insufficient documentation

## 2013-10-09 DIAGNOSIS — E119 Type 2 diabetes mellitus without complications: Secondary | ICD-10-CM | POA: Insufficient documentation

## 2013-10-09 DIAGNOSIS — I5189 Other ill-defined heart diseases: Secondary | ICD-10-CM | POA: Insufficient documentation

## 2013-10-09 DIAGNOSIS — R809 Proteinuria, unspecified: Secondary | ICD-10-CM | POA: Insufficient documentation

## 2013-10-09 DIAGNOSIS — E7800 Pure hypercholesterolemia, unspecified: Secondary | ICD-10-CM | POA: Insufficient documentation

## 2013-10-09 DIAGNOSIS — K760 Fatty (change of) liver, not elsewhere classified: Secondary | ICD-10-CM | POA: Insufficient documentation

## 2013-10-09 DIAGNOSIS — I10 Essential (primary) hypertension: Secondary | ICD-10-CM | POA: Insufficient documentation

## 2013-10-09 DIAGNOSIS — R251 Tremor, unspecified: Secondary | ICD-10-CM | POA: Insufficient documentation

## 2013-10-10 ENCOUNTER — Ambulatory Visit: Payer: 59 | Admitting: Cardiology

## 2017-06-30 ENCOUNTER — Encounter: Payer: Self-pay | Admitting: *Deleted

## 2017-11-24 HISTORY — PX: SPINAL CORD STIMULATOR IMPLANT: SHX2422

## 2019-06-20 ENCOUNTER — Ambulatory Visit: Payer: Self-pay | Admitting: Allergy

## 2019-06-22 ENCOUNTER — Ambulatory Visit: Payer: Self-pay | Admitting: Allergy

## 2019-06-27 ENCOUNTER — Other Ambulatory Visit: Payer: Self-pay

## 2019-06-27 ENCOUNTER — Ambulatory Visit (INDEPENDENT_AMBULATORY_CARE_PROVIDER_SITE_OTHER): Payer: Worker's Compensation | Admitting: Allergy

## 2019-06-27 ENCOUNTER — Encounter: Payer: Self-pay | Admitting: Allergy

## 2019-06-27 VITALS — BP 138/100 | HR 92 | Temp 97.0°F | Resp 16 | Ht 69.0 in | Wt 181.2 lb

## 2019-06-27 DIAGNOSIS — L299 Pruritus, unspecified: Secondary | ICD-10-CM | POA: Diagnosis not present

## 2019-06-27 NOTE — Patient Instructions (Addendum)
Get bloodwork Start proper skin care as below  May use over the counter antihistamines such as Zyrtec (cetirizine), Claritin (loratadine), Allegra (fexofenadine), or Xyzal (levocetirizine) daily as needed.  Return in 2 weeks for possible patch testing   Skin care recommendations  Bath time: . Always use lukewarm water. AVOID very hot or cold water. Marland Kitchen Keep bathing time to 5-10 minutes. . Do NOT use bubble bath. . Use a mild soap and use just enough to wash the dirty areas. . Do NOT scrub skin vigorously.  . After bathing, pat dry your skin with a towel. Do NOT rub or scrub the skin.  Moisturizers and prescriptions:  . ALWAYS apply moisturizers immediately after bathing (within 3 minutes). This helps to lock-in moisture. . Use the moisturizer several times a day over the whole body. Kermit Balo summer moisturizers include: Aveeno, CeraVe, Cetaphil. Kermit Balo winter moisturizers include: Aquaphor, Vaseline, Cerave, Cetaphil, Eucerin, Vanicream. . When using moisturizers along with medications, the moisturizer should be applied about one hour after applying the medication to prevent diluting effect of the medication or moisturize around where you applied the medications. When not using medications, the moisturizer can be continued twice daily as maintenance.  Laundry and clothing: . Avoid laundry products with added color or perfumes. . Use unscented hypo-allergenic laundry products such as Tide free, Cheer free & gentle, and All free and clear.  . If the skin still seems dry or sensitive, you can try double-rinsing the clothes. . Avoid tight or scratchy clothing such as wool. . Do not use fabric softeners or dyer sheets.

## 2019-06-27 NOTE — Progress Notes (Signed)
New Patient Note  RE: Vincent Vargas MRN: [id number] DOB: 1966-11-18 Date of Office Visit: 06/27/2019  Referring provider: Lucia Bitter., MD Primary care provider: Katherina Mires, MD  Chief Complaint: Allergic Reaction (Titanium )  History of Present Illness: I had the pleasure of seeing [name] for initial evaluation at the [facility] [place] on 06/28/2019. He is a 53 y.o. male, who is referred here by Dr. 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   Patient noted itching on his forearms b/l. He noticed the pruritus being more prevalent after the spinal cord simulator was moved from his back to the abdominal area on 05/14/2018. He had the initial one place on 02/05/2018. Local area is healing well with no erythema or itching. There is no rash associated with the arm pruritus. Sometimes he gets pruritic multiple times a day or can go without itching for a few days. Denies any other associated symptoms. Pruritus lasts for 10-15 minutes at a time.   Denies any fevers, chills, changes in medications, foods, personal care products or recent infections. He has tried the following therapies: none. Systemic steroids no.  Previous work up includes: none. Previous history of rash/hives/pruritus: no. Patient is up to date with the following cancer screening tests: prostate check, colonoscopy.  No personal history of metal allergy but mother had issues with sterling silver. He can wear gold, silver watch or any other metals with no issues.   Patient concerned whether this itching could be coming from his spinal cord simulator.   Assessment and Plan: Nathanael is a 53 y.o. male with: Pruritus Ongoing forearm pruritus without a rash since 2019. Possibly worse after moving spinal cord simulator from his back to abdominal area. Area is healing well with no issues. Sometimes has daily pruritus lasting 10-15 minutes and sometimes no pruritus for a few days. No triggers noted.  Denies changes in medications, personal care products or diet around this time. Patient brought metal samples provided by the manufacturer of the simulator. No personal history of metal allergy and wears watches and rings with no issues.   Discussed with patient that based on his clinical history, it is unlikely that the metals/components of the spinal simulator are causing his pruritus.   Metal hypersensitivity usually occurs at the site of implant with associated rash and pruritus which is not the case for this patient.  Will get some bloodwork to make sure there is no other etiologies of the pruritus.  The number one cause of pruritus is usually xerosis in this age group.   Reviewed proper skin care with moisturization daily.  May use over the counter antihistamines such as Zyrtec (cetirizine), Claritin (loratadine), Allegra (fexofenadine), or Xyzal (levocetirizine) daily as needed to see if it helps with the itching.  If still no improvement then we can consider patch testing to metal panel and the samples patient brought in at next visit.    Return in about 2 weeks (around 07/11/2019) for Patch testing.  Lab Orders     CBC with Differential/Platelet     Comprehensive metabolic panel     Thyroid Cascade Profile  Other allergy screening: Asthma: no Rhino conjunctivitis: no Food allergy: no Medication allergy: yes  chantix - chest pain Lisinopril - fatigue  toprol - fatigue  Hymenoptera allergy: no Urticaria: no Eczema:no History of recurrent infections suggestive of immunodeficency: no  Diagnostics: None  Past Medical History: Patient Active Problem List   Diagnosis Date Noted  Pruritus 06/27/2019   Hypertension    Hypercholesteremia    Tremor    Fatty liver    Proteinuria    Low back pain    Obesity    Diabetes (HCC)    Abnormal liver enzymes    Chest pain    Diastolic dysfunction    Past Medical History:  Diagnosis Date   Abnormal liver  enzymes    Chest pain    Coronary artery disease 2008   nonobstructive ASCAD of RCA by coronayr CTA   Diabetes (HCC)    Diastolic dysfunction    Fatty liver    Hypercholesteremia    Hypertension    Low back pain    Obesity    OSA (obstructive sleep apnea)    moderate OSA with AHI 20.76/hr on CPAP   Pericarditis 2008   Proteinuria    Tremor    Past Surgical History: Past Surgical History:  Procedure Laterality Date   ELBOW SURGERY     shoulder surgeryx3     Medication List:  Current Outpatient Medications  Medication Sig Dispense Refill   amLODipine (NORVASC) 5 MG tablet TAKE 1 TABLET BY MOUTH EVERY DAY     atorvastatin (LIPITOR) 10 MG tablet Take by mouth.     Continuous Blood Gluc Receiver (FREESTYLE LIBRE READER) DEVI by Does not apply route.     Continuous Blood Gluc Sensor (FREESTYLE LIBRE SENSOR SYSTEM) MISC by Does not apply route.     empagliflozin (JARDIANCE) 10 MG TABS tablet      gabapentin (NEURONTIN) 600 MG tablet Take by mouth.     glipiZIDE (GLUCOTROL XL) 10 MG 24 hr tablet Take by mouth.     glucose blood (FREESTYLE PRECISION NEO TEST) test strip Dispense and use as prescribed.     HYDROcodone-acetaminophen (NORCO) 10-325 MG tablet Take by mouth.     lisinopril (ZESTRIL) 5 MG tablet Take by mouth.     metFORMIN (GLUCOPHAGE-XR) 500 MG 24 hr tablet Take by mouth.     naproxen sodium (ALEVE) 220 MG tablet Take 2 tablets every 12 hours until stone passes.     simvastatin (ZOCOR) 10 MG tablet TAKE ONE TABLET (10 MG DOSE) BY MOUTH EVERY EVENING.     tadalafil (CIALIS) 10 MG tablet Take by mouth.     UNKNOWN TO PATIENT      No current facility-administered medications for this visit.    Allergies: Allergies  Allergen Reactions   Varenicline Palpitations    Chest pain   Lisinopril     fatigue   Toprol Xl [Metoprolol Tartrate]     fatigue   Social History: Social History   Socioeconomic History   Marital status: Married     Spouse name: Not on file   Number of children: Not on file   Years of education: Not on file   Highest education level: Not on file  Occupational History   Not on file  Social Needs   Financial resource strain: Not on file   Food insecurity    Worry: Not on file    Inability: Not on file   Transportation needs    Medical: Not on file    Non-medical: Not on file  Tobacco Use   Smoking status: Never Smoker   Smokeless tobacco: Former NeurosurgeonUser  Substance and Sexual Activity   Alcohol use: No   Drug use: No   Sexual activity: Not on file  Lifestyle   Physical activity    Days per week: Not on  file    Minutes per session: Not on file   Stress: Not on file  Relationships   Social connections    Talks on phone: Not on file    Gets together: Not on file    Attends religious service: Not on file    Active member of club or organization: Not on file    Attends meetings of clubs or organizations: Not on file    Relationship status: Not on file  Other Topics Concern   Not on file  Social History Narrative   Not on file   Lives in a 52 year old home. Smoking: denies Occupation: Engineer, agriculturalcable tech  Environmental HistorySurveyor, minerals: Water Damage/mildew in the house: no Engineer, civil (consulting)Carpet in the family room: yes Carpet in the bedroom: yes Heating: gas Cooling: central Pet: yes 1 dog  Family History: Family History  Problem Relation Age of Onset   Lung cancer Mother    Heart disease Father    Hypertension Father    Review of Systems  Constitutional: Negative for appetite change, chills, fever and unexpected weight change.  HENT: Negative for congestion and rhinorrhea.   Eyes: Negative for itching.  Respiratory: Negative for cough, chest tightness, shortness of breath and wheezing.   Cardiovascular: Negative for chest pain.  Gastrointestinal: Negative for abdominal pain.  Genitourinary: Negative for difficulty urinating.  Skin: Negative for rash.       pruritus    Allergic/Immunologic: Negative for environmental allergies and food allergies.  Neurological: Negative for headaches.   Objective: BP (!) 138/100    Pulse 92    Temp (!) 97 F (36.1 C) (Temporal)    Resp 16    Ht 5\' 9"  (1.753 m)    Wt 181 lb 3.2 oz (82.2 kg)    SpO2 97%    BMI 26.76 kg/m  Body mass index is 26.76 kg/m. Physical Exam  Constitutional: He is oriented to person, place, and time. He appears well-developed and well-nourished.  HENT:  Head: Normocephalic and atraumatic.  Right Ear: External ear normal.  Left Ear: External ear normal.  Nose: Nose normal.  Mouth/Throat: Oropharynx is clear and moist.  Eyes: Conjunctivae and EOM are normal.  Neck: Neck supple.  Cardiovascular: Normal rate, regular rhythm and normal heart sounds. Exam reveals no gallop and no friction rub.  No murmur heard. Pulmonary/Chest: Effort normal and breath sounds normal. He has no wheezes. He has no rales.  Abdominal: Soft.  Neurological: He is alert and oriented to person, place, and time.  Skin: Skin is warm. Rash noted.  Vertical well healed scar on anterior abdominal area. No tenderness with palpitation.  No rash on arms noted.  Psychiatric: He has a normal mood and affect. His behavior is normal.  Nursing note and vitals reviewed.  The plan was reviewed with the patient/family, and all questions/concerned were addressed.  It was my pleasure to see Vincent Vargas today and participate in his care. Please feel free to contact me with any questions or concerns.  Sincerely,  Wyline MoodYoon Varnell Orvis, DO Allergy & Immunology  [facility] [place] office: [phone] [facility] office: [phone] [place] office: [phone]

## 2019-06-28 ENCOUNTER — Encounter: Payer: Self-pay | Admitting: Allergy

## 2019-06-28 LAB — COMPREHENSIVE METABOLIC PANEL
ALT: 22 IU/L (ref 0–44)
AST: 20 IU/L (ref 0–40)
Albumin/Globulin Ratio: 2.2 (ref 1.2–2.2)
Albumin: 5 g/dL — ABNORMAL HIGH (ref 3.8–4.9)
Alkaline Phosphatase: 69 IU/L (ref 39–117)
BUN/Creatinine Ratio: 10 (ref 9–20)
BUN: 10 mg/dL (ref 6–24)
Bilirubin Total: 0.7 mg/dL (ref 0.0–1.2)
CO2: 26 mmol/L (ref 20–29)
Calcium: 9.8 mg/dL (ref 8.7–10.2)
Chloride: 99 mmol/L (ref 96–106)
Creatinine, Ser: 0.96 mg/dL (ref 0.76–1.27)
GFR calc Af Amer: 105 mL/min/{1.73_m2} (ref >59)
GFR calc non Af Amer: 91 mL/min/{1.73_m2} (ref >59)
Globulin, Total: 2.3 g/dL (ref 1.5–4.5)
Glucose: 118 mg/dL — ABNORMAL HIGH (ref 65–99)
Potassium: 4.3 mmol/L (ref 3.5–5.2)
Sodium: 139 mmol/L (ref 134–144)
Total Protein: 7.3 g/dL (ref 6.0–8.5)

## 2019-06-28 LAB — CBC WITH DIFFERENTIAL/PLATELET
Basophils Absolute: 0 10*3/uL (ref 0.0–0.2)
Basos: 1 %
EOS (ABSOLUTE): 0 10*3/uL (ref 0.0–0.4)
Eos: 0 %
Hematocrit: 39.8 % (ref 37.5–51.0)
Hemoglobin: 13.6 g/dL (ref 13.0–17.7)
Immature Grans (Abs): 0 10*3/uL (ref 0.0–0.1)
Immature Granulocytes: 0 %
Lymphocytes Absolute: 1.9 10*3/uL (ref 0.7–3.1)
Lymphs: 37 %
MCH: 31.1 pg (ref 26.6–33.0)
MCHC: 34.2 g/dL (ref 31.5–35.7)
MCV: 91 fL (ref 79–97)
Monocytes Absolute: 0.4 10*3/uL (ref 0.1–0.9)
Monocytes: 9 %
Neutrophils Absolute: 2.7 10*3/uL (ref 1.4–7.0)
Neutrophils: 53 %
Platelets: 255 10*3/uL (ref 150–450)
RBC: 4.38 x10E6/uL (ref 4.14–5.80)
RDW: 12.5 % (ref 11.6–15.4)
WBC: 5.1 10*3/uL (ref 3.4–10.8)

## 2019-06-28 LAB — THYROID CASCADE PROFILE: TSH: 1.03 u[IU]/mL (ref 0.450–4.500)

## 2019-06-28 NOTE — Assessment & Plan Note (Addendum)
Ongoing forearm pruritus without a rash since 2019. Possibly worse after moving spinal cord simulator from his back to abdominal area. Area is healing well with no issues. Sometimes has daily pruritus lasting 10-15 minutes and sometimes no pruritus for a few days. No triggers noted. Denies changes in medications, personal care products or diet around this time. Patient brought metal samples provided by the manufacturer of the simulator. No personal history of metal allergy and wears watches and rings with no issues.   Discussed with patient that based on his clinical history, it is unlikely that the metals/components of the spinal simulator are causing his pruritus.   Metal hypersensitivity usually occurs at the site of implant with associated rash and pruritus which is not the case for this patient.  Will get some bloodwork to make sure there is no other etiologies of the pruritus.  The number one cause of pruritus is usually xerosis in this age group.   Reviewed proper skin care with moisturization daily.  May use over the counter antihistamines such as Zyrtec (cetirizine), Claritin (loratadine), Allegra (fexofenadine), or Xyzal (levocetirizine) daily as needed to see if it helps with the itching.  If still no improvement then we can consider patch testing to metal panel and the samples patient brought in at next visit.

## 2019-06-30 ENCOUNTER — Telehealth: Payer: Self-pay | Admitting: Allergy

## 2019-06-30 NOTE — Telephone Encounter (Signed)
Vincent Vargas attorney called [place] back states that we can send medical records for patient to [facility] once all results are in. Advised he has an appt for metal and patch testing

## 2019-06-30 NOTE — Telephone Encounter (Signed)
I saw him for itching only.  From my standpoint there is no limitation but I understand he has other medical issues.  Please call patient to clarify this because if he needs a letter that should come from his other physicians who are addressing his other medical issues.

## 2019-06-30 NOTE — Telephone Encounter (Signed)
Called patient he states that the one that writes his limitations is his PCP he does not need a letter. He states he is having his lawyer call us due to [facility] contacting us they should not have dont this as we are giving a second opinion/ testing to things he is having a reaction to at work

## 2019-06-30 NOTE — Telephone Encounter (Signed)
Dr. Maudie Mercury, [facility] called this morning.  They are working with Vincent Vargas with his workman's comp.  They requested that a letter be written explaining any limitations if he has any or any restrictions. Dorien will have for returning to work.  They state they need this letter to keep his benefits active.  Please advise.

## 2019-07-11 ENCOUNTER — Encounter: Payer: Self-pay | Admitting: Allergy

## 2019-07-11 ENCOUNTER — Other Ambulatory Visit: Payer: Self-pay

## 2019-07-11 ENCOUNTER — Ambulatory Visit (INDEPENDENT_AMBULATORY_CARE_PROVIDER_SITE_OTHER): Payer: Worker's Compensation | Admitting: Allergy

## 2019-07-11 VITALS — BP 130/82 | HR 82 | Resp 16

## 2019-07-11 DIAGNOSIS — L299 Pruritus, unspecified: Secondary | ICD-10-CM

## 2019-07-11 NOTE — Progress Notes (Signed)
   Follow Up Note  RE: Vincent Vargas MRN: [id number] DOB: 08-13-1966 Date of Office Visit: 07/11/2019  Referring provider: Lucia Bitter., MD Primary care provider: Lucia Bitter., MD  History of Present Illness: I had the pleasure of seeing Vincent Vargas for a follow up visit at the [facility] [place] [facility] [place] on 07/13/2019. He is a 53 y.o. male, who is being followed for pruritus. Today he is here for patch test placement, given suspected history of contact dermatitis causing the pruritus.   Not better with antihistamines or moisturizing skin. Itching is unchanged.   Diagnostics: Metal panel test patches (on the right)  and additional metals (on the left) were placed.  TC - tecothane (IPG Port Plug) PL - pellathane (percutaneous lead body) 60 - [id number]-7322 (silicone suture sleeves)  T6 - titanium, Ti-6-4 (IPG case) SS - Stainless steel, 316L (IPG connector block) MP - MP35N Allow (IPG port plug) PI - Platinum-iridium (distal contacts on the percutaneous lead)   Assessment and Plan: Vincent Vargas is a 53 y.o. male with: Pruritus Past history - Ongoing forearm pruritus without a rash since 2019. Possibly worse after moving spinal cord simulator from his back to abdominal area. Area is healing well with no issues. Sometimes has daily pruritus lasting 10-15 minutes and sometimes no pruritus for a few days. No triggers noted. Denies changes in medications, personal care products or diet around this time. Patient brought metal samples provided by the manufacturer of the simulator. No personal history of metal allergy and wears watches and rings with no issues.  Interim history - No improvement with antihistamines or proper skin care.   Bloodwork - Blood count, liver function, kidney function, electrolytes and thyroid levels were all normal.   Based on his clinical history, it is unlikely that the metals/components of the spinal simulator are causing his pruritus. Metal  hypersensitivity usually occurs at the site of implant with associated rash and pruritus which is not the case for this patient.  Continue proper skin care with moisturization daily.  May use over the counter antihistamines such as Zyrtec (cetirizine), Claritin (loratadine), Allegra (fexofenadine), or Xyzal (levocetirizine) daily as needed to see if it helps with the itching.  Placed metal patch panel and additional samples brought in by patient on his back today.   It was my pleasure to see Vincent Vargas today and participate in his care. Please feel free to contact me with any questions or concerns.  Sincerely,  Rexene Alberts, DO Allergy & Immunology  [facility] office: [phone] [place]

## 2019-07-13 ENCOUNTER — Other Ambulatory Visit: Payer: Self-pay

## 2019-07-13 ENCOUNTER — Telehealth: Payer: Self-pay

## 2019-07-13 ENCOUNTER — Encounter: Payer: Self-pay | Admitting: Allergy

## 2019-07-13 ENCOUNTER — Ambulatory Visit: Payer: Self-pay | Admitting: Allergy

## 2019-07-13 DIAGNOSIS — L299 Pruritus, unspecified: Secondary | ICD-10-CM

## 2019-07-13 NOTE — Progress Notes (Signed)
   Follow Up Note  RE: Vincent Vargas MRN: [id number] DOB: 27-Oct-1966 Date of Office Visit: 07/13/2019  Referring provider: Katherina Mires, MD Primary care provider: Lucia Bitter., MD  History of Present Illness: I had the pleasure of seeing Vincent Vargas for a follow up visit at the [facility] [place] [facility] [place] on 07/13/2019. He is a 53 y.o. male, who is being followed for pruritus. Today he is here for initial patch test interpretation, given suspected history of contact dermatitis.   Diagnostics:  Metal panel and additional metal sample 48 hour reading:  Negative.    Assessment and Plan: Vincent Vargas is a 53 y.o. male with: Pruritus Past history - Ongoing forearm pruritus without a rash since 2019. Possibly worse after moving spinal cord simulator from his back to abdominal area. Area is healing well with no issues. Sometimes has daily pruritus lasting 10-15 minutes and sometimes no pruritus for a few days. No triggers noted. Denies changes in medications, personal care products or diet around this time. Patient brought metal samples provided by the manufacturer of the simulator. No personal history of metal allergy and wears watches and rings with no issues.  Interim history - Negative initial patch test reading to metal panel and metal samples provided.   Return in about 2 days (around 07/15/2019) for Patch reading.  It was my pleasure to see Vincent Vargas today and participate in his care. Please feel free to contact me with any questions or concerns.  Sincerely,  Rexene Alberts, DO Allergy & Immunology  [facility] office: [phone] [place]

## 2019-07-13 NOTE — Assessment & Plan Note (Signed)
Past history - Ongoing forearm pruritus without a rash since 2019. Possibly worse after moving spinal cord simulator from his back to abdominal area. Area is healing well with no issues. Sometimes has daily pruritus lasting 10-15 minutes and sometimes no pruritus for a few days. No triggers noted. Denies changes in medications, personal care products or diet around this time. Patient brought metal samples provided by the manufacturer of the simulator. No personal history of metal allergy and wears watches and rings with no issues.  Interim history - No improvement with antihistamines or proper skin care.   Bloodwork - Blood count, liver function, kidney function, electrolytes and thyroid levels were all normal.   Based on his clinical history, it is unlikely that the metals/components of the spinal simulator are causing his pruritus. Metal hypersensitivity usually occurs at the site of implant with associated rash and pruritus which is not the case for this patient.  Continue proper skin care with moisturization daily.  May use over the counter antihistamines such as Zyrtec (cetirizine), Claritin (loratadine), Allegra (fexofenadine), or Xyzal (levocetirizine) daily as needed to see if it helps with the itching.  Placed metal patch panel and additional samples brought in by patient on his back today.

## 2019-07-13 NOTE — Assessment & Plan Note (Addendum)
Past history - Ongoing forearm pruritus without a rash since 2019. Possibly worse after moving spinal cord simulator from his back to abdominal area. Area is healing well with no issues. Sometimes has daily pruritus lasting 10-15 minutes and sometimes no pruritus for a few days. No triggers noted. Denies changes in medications, personal care products or diet around this time. Patient brought metal samples provided by the manufacturer of the simulator. No personal history of metal allergy and wears watches and rings with no issues.  Interim history - Negative initial patch test reading to metal panel and metal samples provided.

## 2019-07-13 NOTE — Telephone Encounter (Signed)
Needs our billing office to call her.  All bills need to go to his workers comp.  He is getting bills from [place] and is not happy.  Please call his law office of [place] and [place].  Ask for Melissa.  Direct line [phone].

## 2019-07-13 NOTE — Telephone Encounter (Signed)
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 I spoke with Melissa and she is going to fax over the information she has.   Dee/Jennifer:  Do we have any information/referral on this?  Thank you.

## 2019-07-15 ENCOUNTER — Ambulatory Visit: Payer: Self-pay | Admitting: Allergy

## 2019-07-15 ENCOUNTER — Encounter: Payer: Self-pay | Admitting: Allergy

## 2019-07-15 ENCOUNTER — Other Ambulatory Visit: Payer: Self-pay

## 2019-07-15 ENCOUNTER — Encounter: Payer: Self-pay | Admitting: Family Medicine

## 2019-07-15 DIAGNOSIS — L299 Pruritus, unspecified: Secondary | ICD-10-CM

## 2019-07-15 NOTE — Progress Notes (Signed)
   Follow Up Note  RE: Vincent Vargas MRN: [id number] DOB: 04-Jan-1966 Date of Office Visit: 07/15/2019  Referring provider: Katherina Mires, MD Primary care provider: Lucia Bitter., MD  History of Present Illness: I had the pleasure of seeing Vincent Vargas for a follow up visit at the [facility] [place] [facility] [place] on 07/15/2019. He is a 53 y.o. male, who is being followed for pruritus. Today he is here for final patch test interpretation, given suspected history of contact dermatitis.   Diagnostics:  Metal panel and additional metal sample 48 hour reading:  Negative.    Assessment and Plan: Metal panel patch testing is negative.    He will follow-up with Dr. Maudie Mercury in 4 weeks  It was my pleasure to see Vincent Vargas today and participate in his care. Please feel free to contact me with any questions or concerns.  Sincerely, Prudy Feeler, MD [facility] [place]

## 2019-07-20 NOTE — Telephone Encounter (Signed)
Hey I haven't come across anything on this patient.   Sorry.

## 2019-07-21 NOTE — Telephone Encounter (Signed)
I never saw any paperwork or referral on this patient.

## 2019-08-15 ENCOUNTER — Ambulatory Visit: Payer: Self-pay | Admitting: Allergy

## 2020-04-27 ENCOUNTER — Other Ambulatory Visit: Payer: Self-pay | Admitting: Surgery

## 2020-05-21 ENCOUNTER — Encounter (HOSPITAL_BASED_OUTPATIENT_CLINIC_OR_DEPARTMENT_OTHER): Payer: Self-pay | Admitting: Surgery

## 2020-05-21 ENCOUNTER — Other Ambulatory Visit: Payer: Self-pay

## 2020-05-25 ENCOUNTER — Other Ambulatory Visit (HOSPITAL_COMMUNITY)
Admission: RE | Admit: 2020-05-25 | Discharge: 2020-05-25 | Disposition: A | Discharge status: Home / Self Care | Payer: BC Managed Care – PPO | Source: Ambulatory Visit | Attending: Surgery | Admitting: Surgery

## 2020-05-25 ENCOUNTER — Encounter (HOSPITAL_BASED_OUTPATIENT_CLINIC_OR_DEPARTMENT_OTHER)
Admission: RE | Admit: 2020-05-25 | Discharge: 2020-05-25 | Disposition: A | Discharge status: Home / Self Care | Payer: BC Managed Care – PPO | Source: Ambulatory Visit | Attending: Surgery | Admitting: Surgery

## 2020-05-25 DIAGNOSIS — Z01818 Encounter for other preprocedural examination: Secondary | ICD-10-CM | POA: Diagnosis present

## 2020-05-25 DIAGNOSIS — Z20822 Contact with and (suspected) exposure to covid-19: Secondary | ICD-10-CM | POA: Diagnosis not present

## 2020-05-25 LAB — BASIC METABOLIC PANEL
Anion gap: 9 (ref 5–15)
BUN: 9 mg/dL (ref 6–20)
CO2: 28 mmol/L (ref 22–32)
Calcium: 9.5 mg/dL (ref 8.9–10.3)
Chloride: 103 mmol/L (ref 98–111)
Creatinine, Ser: 0.92 mg/dL (ref 0.61–1.24)
GFR calc Af Amer: >60 mL/min (ref >60)
GFR calc non Af Amer: >60 mL/min (ref >60)
Glucose, Bld: 156 mg/dL — ABNORMAL HIGH (ref 70–99)
Potassium: 3.8 mmol/L (ref 3.5–5.1)
Sodium: 140 mmol/L (ref 135–145)

## 2020-05-25 LAB — SARS CORONAVIRUS 2 (TAT 6-24 HRS): SARS Coronavirus 2: NEGATIVE

## 2020-05-25 MED ORDER — ENSURE PRE-SURGERY PO LIQD: 296.0000 mL | Freq: Once | ORAL | Status: DC

## 2020-05-25 NOTE — Progress Notes (Signed)

## 2020-05-28 ENCOUNTER — Encounter (HOSPITAL_BASED_OUTPATIENT_CLINIC_OR_DEPARTMENT_OTHER): Payer: Self-pay | Admitting: Surgery

## 2020-05-28 NOTE — H&P (Signed)
Vincent Vargas  Location: [facility] Patient #: [id number] DOB: 08/09/1966 Married / Language: English / Race: White Male   History of Present Illness  The patient is a 54 year old male who presents with an inguinal hernia.  Chief complaint: Left inguinal hernia  This is a 54 year old gentleman referred by urology for evaluation of possible left inguinal hernia. He has been having pain in his left groin area for some time. He has not noticed a bulge. The pain is intermittent and sharp and moderate in intensity. He has had no previous hernia surgery before. He is active. He does have chronic pain syndrome and has a spine stimulator. He has active symptoms   Past Surgical History Colon Polyp Removal - Colonoscopy  Colon Polyp Removal - Open  Shoulder Surgery  Right.  Diagnostic Studies History  Colonoscopy  1-5 years ago  Allergies  VARENICLINE   Medication History  Acetaminophen-Codeine (300-30MG  Tablet, Oral) Active. amLODIPine Besylate (5MG  Tablet, Oral) Active. Atorvastatin Calcium (20MG  Tablet, Oral) Active. glipiZIDE ER (10MG  Tablet ER 24HR, Oral) Active. Orphenadrine Citrate ER (100MG  Tablet ER 12HR, Oral) Active. Atorvastatin Calcium (10MG  Tablet, Oral) Active. Lisinopril (10MG  Tablet, Oral) Active. tiZANidine HCl (4MG  Tablet, Oral) Active. Jardiance (25MG  Tablet, Oral) Active. metFORMIN HCl ER (500MG  Tablet ER 24HR, Oral) Active. Tadalafil (10MG  Tablet, Oral) Active. Medications Reconciled  Other Problems Diabetes Mellitus  Gastroesophageal Reflux Disease  High blood pressure  Hypercholesterolemia  Kidney Stone  Sleep Apnea     Review of Systems General Present- Appetite Loss and Night Sweats. Not Present- Chills, Fatigue, Fever, Weight Gain and Weight Loss. Skin Present- Rash. Not Present- Change in Wart/Mole, Dryness, Hives, Jaundice, New Lesions, Non-Healing Wounds and Ulcer. HEENT Present- Ringing in the  Ears. Not Present- Earache, Hearing Loss, Hoarseness, Nose Bleed, Oral Ulcers, Seasonal Allergies, Sinus Pain, Sore Throat, Visual Disturbances, Wears glasses/contact lenses and Yellow Eyes. Respiratory Not Present- Bloody sputum, Chronic Cough, Difficulty Breathing, Snoring and Wheezing. Breast Not Present- Breast Mass, Breast Pain, Nipple Discharge and Skin Changes. Cardiovascular Not Present- Chest Pain, Difficulty Breathing Lying Down, Leg Cramps, Palpitations, Rapid Heart Rate, Shortness of Breath and Swelling of Extremities. Gastrointestinal Present- Abdominal Pain, Gets full quickly at meals and Indigestion. Not Present- Bloating, Bloody Stool, Change in Bowel Habits, Chronic diarrhea, Constipation, Difficulty Swallowing, Excessive gas, Hemorrhoids, Nausea, Rectal Pain and Vomiting. Male Genitourinary Not Present- Blood in Urine, Change in Urinary Stream, Frequency, Impotence, Nocturia, Painful Urination, Urgency and Urine Leakage.  Vitals   Weight: 183.6 lb Height: 69in Body Surface Area: 1.99 m Body Mass Index: 27.11 kg/m  Pulse: 107 (Regular)  BP: 154/92(Sitting, Left Arm, Standard)     Physical Exam  The physical exam findings are as follows: Note: He appears well in exam.  He does have an easily reducible, mildly tender left inguinal hernia without evidence for right inguinal hernia or umbilical hernia. His pain stimulator is in the abdominal wall on the left side of the abdomen above the umbilicus    Assessment & Plan   LEFT INGUINAL HERNIA (K40.90)  Impression: This is a patient with a left inguinal hernia. I will discussion with him regarding abdominal anatomy and hernias. We discussed the reasons to repair hernias including avoiding the hernia enlarging or incarceration. He is symptomatic from the hernia and would like to proceed with surgery. Given his spinal cord stimulator, I would rather do this as an open surgery laparoscopic for fear of disrupting the  wires. I expect the surgical procedure in  detail. We discussed the risk which includes but is not limited to bleeding, infection, injury to surrounding structures, nerve entrapment, chronic pain, use of mesh, hernia recurrence, cardiopulmonary issues, postoperative recovery, etc. He understands and wishes to proceed with surgery which will be scheduled.

## 2020-05-29 ENCOUNTER — Ambulatory Visit (HOSPITAL_BASED_OUTPATIENT_CLINIC_OR_DEPARTMENT_OTHER): Payer: BC Managed Care – PPO | Admitting: Certified Registered Nurse Anesthetist

## 2020-05-29 ENCOUNTER — Encounter (HOSPITAL_BASED_OUTPATIENT_CLINIC_OR_DEPARTMENT_OTHER): Payer: Self-pay | Admitting: Surgery

## 2020-05-29 ENCOUNTER — Encounter (HOSPITAL_BASED_OUTPATIENT_CLINIC_OR_DEPARTMENT_OTHER)
Admission: RE | Disposition: A | Discharge status: Home / Self Care | Payer: Self-pay | Source: Home / Self Care | Attending: Surgery

## 2020-05-29 ENCOUNTER — Other Ambulatory Visit: Payer: Self-pay

## 2020-05-29 ENCOUNTER — Ambulatory Visit (HOSPITAL_BASED_OUTPATIENT_CLINIC_OR_DEPARTMENT_OTHER)
Admission: RE | Admit: 2020-05-29 | Discharge: 2020-05-29 | Disposition: A | Discharge status: Home / Self Care | Payer: BC Managed Care – PPO | Attending: Surgery | Admitting: Surgery

## 2020-05-29 DIAGNOSIS — G894 Chronic pain syndrome: Secondary | ICD-10-CM | POA: Insufficient documentation

## 2020-05-29 DIAGNOSIS — E7800 Pure hypercholesterolemia, unspecified: Secondary | ICD-10-CM | POA: Insufficient documentation

## 2020-05-29 DIAGNOSIS — Z791 Long term (current) use of non-steroidal anti-inflammatories (NSAID): Secondary | ICD-10-CM | POA: Diagnosis not present

## 2020-05-29 DIAGNOSIS — K4090 Unilateral inguinal hernia, without obstruction or gangrene, not specified as recurrent: Secondary | ICD-10-CM | POA: Insufficient documentation

## 2020-05-29 DIAGNOSIS — Z79891 Long term (current) use of opiate analgesic: Secondary | ICD-10-CM | POA: Insufficient documentation

## 2020-05-29 DIAGNOSIS — Z888 Allergy status to other drugs, medicaments and biological substances status: Secondary | ICD-10-CM | POA: Diagnosis not present

## 2020-05-29 DIAGNOSIS — Z9682 Presence of neurostimulator: Secondary | ICD-10-CM | POA: Diagnosis not present

## 2020-05-29 DIAGNOSIS — I10 Essential (primary) hypertension: Secondary | ICD-10-CM | POA: Insufficient documentation

## 2020-05-29 DIAGNOSIS — E119 Type 2 diabetes mellitus without complications: Secondary | ICD-10-CM | POA: Diagnosis not present

## 2020-05-29 DIAGNOSIS — Z7984 Long term (current) use of oral hypoglycemic drugs: Secondary | ICD-10-CM | POA: Diagnosis not present

## 2020-05-29 DIAGNOSIS — G4730 Sleep apnea, unspecified: Secondary | ICD-10-CM | POA: Diagnosis not present

## 2020-05-29 DIAGNOSIS — Z79899 Other long term (current) drug therapy: Secondary | ICD-10-CM | POA: Insufficient documentation

## 2020-05-29 HISTORY — DX: Complex regional pain syndrome I, unspecified: G90.50

## 2020-05-29 HISTORY — PX: INGUINAL HERNIA REPAIR: SHX194

## 2020-05-29 HISTORY — DX: Presence of other specified functional implants: Z96.89

## 2020-05-29 HISTORY — PX: INSERTION OF MESH: SHX5868

## 2020-05-29 HISTORY — DX: Other chronic pain: G89.29

## 2020-05-29 LAB — GLUCOSE, CAPILLARY
Glucose-Capillary: 153 mg/dL — ABNORMAL HIGH (ref 70–99)
Glucose-Capillary: 133 mg/dL — ABNORMAL HIGH (ref 70–99)

## 2020-05-29 SURGERY — REPAIR, HERNIA, INGUINAL, ADULT
Anesthesia: General | Site: Groin | Laterality: Left

## 2020-05-29 MED ORDER — OXYCODONE HCL 5 MG PO TABS: 5.0000 mg | ORAL_TABLET | Freq: Once | ORAL | Status: DC | PRN

## 2020-05-29 MED ORDER — ACETAMINOPHEN 500 MG PO TABS
ORAL_TABLET | ORAL | Status: AC
  Filled 2020-05-29: qty 2

## 2020-05-29 MED ORDER — PROPOFOL 10 MG/ML IV BOLUS
INTRAVENOUS | Status: DC | PRN
  Administered 2020-05-29: 200 mg via INTRAVENOUS

## 2020-05-29 MED ORDER — OXYCODONE HCL 5 MG PO TABS: 5.0000 mg | ORAL_TABLET | Freq: Four times a day (QID) | ORAL | 0 refills | Status: DC | PRN

## 2020-05-29 MED ORDER — PROPOFOL 10 MG/ML IV BOLUS
INTRAVENOUS | Status: AC
  Filled 2020-05-29: qty 20

## 2020-05-29 MED ORDER — OXYCODONE HCL 5 MG/5ML PO SOLN: 5.0000 mg | Freq: Once | ORAL | Status: DC | PRN

## 2020-05-29 MED ORDER — GLYCOPYRROLATE 0.2 MG/ML IJ SOLN
INTRAMUSCULAR | Status: DC | PRN
Start: 2020-05-29 — End: 2020-05-29
  Administered 2020-05-29: .2 mg via INTRAVENOUS

## 2020-05-29 MED ORDER — CEFAZOLIN SODIUM-DEXTROSE 2-4 GM/100ML-% IV SOLN
2.0000 g | INTRAVENOUS | Status: AC
  Administered 2020-05-29: 2 g via INTRAVENOUS

## 2020-05-29 MED ORDER — DEXAMETHASONE SODIUM PHOSPHATE 10 MG/ML IJ SOLN
INTRAMUSCULAR | Status: AC
  Filled 2020-05-29: qty 1

## 2020-05-29 MED ORDER — ARTIFICIAL TEARS OPHTHALMIC OINT
TOPICAL_OINTMENT | OPHTHALMIC | Status: DC | PRN
  Administered 2020-05-29: 1 via OPHTHALMIC

## 2020-05-29 MED ORDER — FENTANYL CITRATE (PF) 100 MCG/2ML IJ SOLN
INTRAMUSCULAR | Status: AC
  Filled 2020-05-29: qty 2

## 2020-05-29 MED ORDER — HYDROMORPHONE HCL 1 MG/ML IJ SOLN
INTRAMUSCULAR | Status: AC
  Filled 2020-05-29: qty 0.5

## 2020-05-29 MED ORDER — AMISULPRIDE (ANTIEMETIC) 5 MG/2ML IV SOLN
10.0000 mg | Freq: Once | INTRAVENOUS | Status: AC | PRN
  Administered 2020-05-29: 10 mg via INTRAVENOUS

## 2020-05-29 MED ORDER — LIDOCAINE 2% (20 MG/ML) 5 ML SYRINGE
INTRAMUSCULAR | Status: AC
  Filled 2020-05-29: qty 5

## 2020-05-29 MED ORDER — DEXAMETHASONE SODIUM PHOSPHATE 10 MG/ML IJ SOLN
INTRAMUSCULAR | Status: DC | PRN
  Administered 2020-05-29: 5 mg via INTRAVENOUS

## 2020-05-29 MED ORDER — BUPIVACAINE HCL (PF) 0.5 % IJ SOLN
INTRAMUSCULAR | Status: AC
  Filled 2020-05-29: qty 30

## 2020-05-29 MED ORDER — GLYCOPYRROLATE PF 0.2 MG/ML IJ SOSY
PREFILLED_SYRINGE | INTRAMUSCULAR | Status: AC
  Filled 2020-05-29: qty 1

## 2020-05-29 MED ORDER — MIDAZOLAM HCL 2 MG/2ML IJ SOLN
INTRAMUSCULAR | Status: AC
  Filled 2020-05-29: qty 2

## 2020-05-29 MED ORDER — CEFAZOLIN SODIUM-DEXTROSE 2-4 GM/100ML-% IV SOLN
INTRAVENOUS | Status: AC
  Filled 2020-05-29: qty 100

## 2020-05-29 MED ORDER — MEPERIDINE HCL 25 MG/ML IJ SOLN: 6.2500 mg | INTRAMUSCULAR | Status: DC | PRN

## 2020-05-29 MED ORDER — PROMETHAZINE HCL 25 MG/ML IJ SOLN: 6.2500 mg | INTRAMUSCULAR | Status: DC | PRN

## 2020-05-29 MED ORDER — HYDROMORPHONE HCL 1 MG/ML IJ SOLN
0.2500 mg | INTRAMUSCULAR | Status: DC | PRN
  Administered 2020-05-29 (×3): 0.5 mg via INTRAVENOUS

## 2020-05-29 MED ORDER — ACETAMINOPHEN 500 MG PO TABS
1000.0000 mg | ORAL_TABLET | ORAL | Status: AC
  Administered 2020-05-29: 1000 mg via ORAL

## 2020-05-29 MED ORDER — CHLORHEXIDINE GLUCONATE CLOTH 2 % EX PADS: 6.0000 | MEDICATED_PAD | Freq: Once | CUTANEOUS | Status: DC

## 2020-05-29 MED ORDER — ONDANSETRON HCL 4 MG/2ML IJ SOLN
INTRAMUSCULAR | Status: AC
  Filled 2020-05-29: qty 2

## 2020-05-29 MED ORDER — LACTATED RINGERS IV SOLN: INTRAVENOUS | Status: DC

## 2020-05-29 MED ORDER — ARTIFICIAL TEARS OPHTHALMIC OINT
TOPICAL_OINTMENT | OPHTHALMIC | Status: AC
  Filled 2020-05-29: qty 3.5

## 2020-05-29 MED ORDER — BUPIVACAINE HCL (PF) 0.5 % IJ SOLN
INTRAMUSCULAR | Status: DC | PRN
  Administered 2020-05-29: 20 mL

## 2020-05-29 MED ORDER — LIDOCAINE HCL (CARDIAC) PF 100 MG/5ML IV SOSY
PREFILLED_SYRINGE | INTRAVENOUS | Status: DC | PRN
  Administered 2020-05-29: 60 mg via INTRATRACHEAL

## 2020-05-29 MED ORDER — GABAPENTIN 300 MG PO CAPS: 300.0000 mg | ORAL_CAPSULE | ORAL | Status: DC

## 2020-05-29 MED ORDER — ONDANSETRON HCL 4 MG/2ML IJ SOLN
INTRAMUSCULAR | Status: DC | PRN
  Administered 2020-05-29: 4 mg via INTRAVENOUS

## 2020-05-29 MED ORDER — FENTANYL CITRATE (PF) 100 MCG/2ML IJ SOLN
INTRAMUSCULAR | Status: DC | PRN
  Administered 2020-05-29 (×3): 50 ug via INTRAVENOUS

## 2020-05-29 MED ORDER — AMISULPRIDE (ANTIEMETIC) 5 MG/2ML IV SOLN
INTRAVENOUS | Status: AC
  Filled 2020-05-29: qty 4

## 2020-05-29 MED ORDER — MIDAZOLAM HCL 2 MG/2ML IJ SOLN
INTRAMUSCULAR | Status: DC | PRN
Start: 2020-05-29 — End: 2020-05-29
  Administered 2020-05-29 (×2): 1 mg via INTRAVENOUS

## 2020-05-29 SURGICAL SUPPLY — 38 items
ADH SKN CLS APL DERMABOND .7 (GAUZE/BANDAGES/DRESSINGS) ×1
APL PRP STRL LF DISP 70% ISPRP (MISCELLANEOUS) ×1
BLADE CLIPPER SURG (BLADE) ×2 IMPLANT
BLADE SURG 15 STRL LF DISP TIS (BLADE) ×1 IMPLANT
BLADE SURG 15 STRL SS (BLADE) ×2
CHLORAPREP W/TINT 26 (MISCELLANEOUS) ×2 IMPLANT
COVER BACK TABLE 60X90IN (DRAPES) ×2 IMPLANT
COVER MAYO STAND STRL (DRAPES) ×2 IMPLANT
DERMABOND ADVANCED (GAUZE/BANDAGES/DRESSINGS) ×1
DERMABOND ADVANCED .7 DNX12 (GAUZE/BANDAGES/DRESSINGS) ×1 IMPLANT
DRAIN PENROSE 1/2X12 LTX STRL (WOUND CARE) ×2 IMPLANT
DRAPE LAPAROTOMY 100X72 PEDS (DRAPES) ×2 IMPLANT
DRAPE UTILITY XL STRL (DRAPES) ×2 IMPLANT
ELECT REM PT RETURN 9FT ADLT (ELECTROSURGICAL) ×2
ELECTRODE REM PT RTRN 9FT ADLT (ELECTROSURGICAL) ×1 IMPLANT
GLOVE BIOGEL PI IND STRL 7.0 (GLOVE) IMPLANT
GLOVE BIOGEL PI INDICATOR 7.0 (GLOVE) ×1
GLOVE SURG SIGNA 7.5 PF LTX (GLOVE) ×2 IMPLANT
GLOVE SURG SS PI 7.0 STRL IVOR (GLOVE) ×1 IMPLANT
GOWN STRL REUS W/ TWL LRG LVL3 (GOWN DISPOSABLE) ×1 IMPLANT
GOWN STRL REUS W/ TWL XL LVL3 (GOWN DISPOSABLE) ×1 IMPLANT
GOWN STRL REUS W/TWL LRG LVL3 (GOWN DISPOSABLE) ×2
GOWN STRL REUS W/TWL XL LVL3 (GOWN DISPOSABLE) ×2
MESH PARIETEX PROGRIP LEFT (Mesh General) ×1 IMPLANT
NDL HYPO 25X1 1.5 SAFETY (NEEDLE) ×1 IMPLANT
NEEDLE HYPO 25X1 1.5 SAFETY (NEEDLE) ×2 IMPLANT
NS IRRIG 1000ML POUR BTL (IV SOLUTION) ×1 IMPLANT
PACK BASIN DAY SURGERY FS (CUSTOM PROCEDURE TRAY) ×2 IMPLANT
PENCIL SMOKE EVACUATOR (MISCELLANEOUS) ×2 IMPLANT
SLEEVE SCD COMPRESS KNEE MED (MISCELLANEOUS) ×2 IMPLANT
SPONGE LAP 4X18 RFD (DISPOSABLE) ×2 IMPLANT
SUT MNCRL AB 4-0 PS2 18 (SUTURE) ×2 IMPLANT
SUT VIC AB 2-0 CT1 27 (SUTURE) ×4
SUT VIC AB 2-0 CT1 TAPERPNT 27 (SUTURE) ×2 IMPLANT
SUT VIC AB 3-0 CT1 27 (SUTURE) ×2
SUT VIC AB 3-0 CT1 27XBRD (SUTURE) ×1 IMPLANT
SYR CONTROL 10ML LL (SYRINGE) ×2 IMPLANT
TOWEL GREEN STERILE FF (TOWEL DISPOSABLE) ×2 IMPLANT

## 2020-05-29 NOTE — Anesthesia Preprocedure Evaluation (Signed)
Anesthesia Evaluation  Patient identified by MRN, date of birth, ID band Patient awake    Reviewed: Allergy & Precautions, NPO status , Patient's Chart, lab work & pertinent test results  Airway Mallampati: II  TM Distance: >3 FB Neck ROM: Full    Dental no notable dental hx.    Pulmonary sleep apnea ,    Pulmonary exam normal breath sounds clear to auscultation       Cardiovascular hypertension, Pt. on medications negative cardio ROS Normal cardiovascular exam Rhythm:Regular Rate:Normal     Neuro/Psych negative neurological ROS  negative psych ROS   GI/Hepatic negative GI ROS, Neg liver ROS,   Endo/Other  negative endocrine ROSdiabetes, Type 2, Oral Hypoglycemic Agents  Renal/GU negative Renal ROS  negative genitourinary   Musculoskeletal negative musculoskeletal ROS (+)   Abdominal   Peds negative pediatric ROS (+)  Hematology negative hematology ROS (+)   Anesthesia Other Findings   Reproductive/Obstetrics negative OB ROS                             Anesthesia Physical Anesthesia Plan  ASA: III  Anesthesia Plan: General   Post-op Pain Management:  Regional for Post-op pain   Induction: Intravenous  PONV Risk Score and Plan: 2 and Ondansetron, Midazolam and Treatment may vary due to age or medical condition  Airway Management Planned: LMA  Additional Equipment:   Intra-op Plan:   Post-operative Plan: Extubation in OR  Informed Consent: I have reviewed the patients History and Physical, chart, labs and discussed the procedure including the risks, benefits and alternatives for the proposed anesthesia with the patient or authorized representative who has indicated his/her understanding and acceptance.     Dental advisory given  Plan Discussed with: CRNA  Anesthesia Plan Comments:         Anesthesia Quick Evaluation

## 2020-05-29 NOTE — Transfer of Care (Signed)
Immediate Anesthesia Transfer of Care Note  Patient: Vincent Vargas  Procedure(s) Performed: OPEN LEFT INGUINAL HERNIA REPAIR WITH MESH (Left Groin) INSERTION OF MESH (Left Groin)  Patient Location: PACU  Anesthesia Type:General  Level of Consciousness: awake, alert  and patient cooperative  Airway & Oxygen Therapy: Patient Spontanous Breathing and Patient connected to face mask oxygen  Post-op Assessment: Report given to RN, Post -op Vital signs reviewed and stable, Patient moving all extremities X 4 and Patient able to stick tongue midline  Post vital signs: Reviewed and stable  Last Vitals:  Vitals Value Taken Time  BP 142/103 05/29/20 0906  Temp    Pulse 83 05/29/20 0908  Resp 15 05/29/20 0908  SpO2 100 % 05/29/20 0908  Vitals shown include unvalidated device data.  Last Pain:  Vitals:   05/29/20 0727  TempSrc: Oral  PainSc: 8       Patients Stated Pain Goal: 5 (05/29/20 0727)  Complications: No complications documented.

## 2020-05-29 NOTE — Anesthesia Postprocedure Evaluation (Signed)
Anesthesia Post Note  Patient: Rigoberto Noel  Procedure(s) Performed: OPEN LEFT INGUINAL HERNIA REPAIR WITH MESH (Left Groin) INSERTION OF MESH (Left Groin)     Patient location during evaluation: PACU Anesthesia Type: General Level of consciousness: awake and alert Pain management: pain level controlled Vital Signs Assessment: post-procedure vital signs reviewed and stable Respiratory status: spontaneous breathing, nonlabored ventilation and respiratory function stable Cardiovascular status: blood pressure returned to baseline and stable Postop Assessment: no apparent nausea or vomiting Anesthetic complications: no   No complications documented.  Last Vitals:  Vitals:   05/29/20 0945 05/29/20 1000  BP: (!) 162/102 (!) 166/101  Pulse: 70 70  Resp: 13 14  Temp:    SpO2: 100% 99%    Last Pain:  Vitals:   05/29/20 1000  TempSrc:   PainSc: 4                  Lowella Curb

## 2020-05-29 NOTE — Discharge Instructions (Signed)
[facility]  UMBILICAL OR INGUINAL HERNIA REPAIR: POST OP INSTRUCTIONS  Always review your discharge instruction sheet given to you by the facility where your surgery was performed. IF YOU HAVE DISABILITY OR FAMILY LEAVE FORMS, YOU MUST BRING THEM TO THE OFFICE FOR PROCESSING.   DO NOT GIVE THEM TO YOUR DOCTOR.  1. A  prescription for pain medication may be given to you upon discharge.  Take your pain medication as prescribed, if needed.  If narcotic pain medicine is not needed, then you may take acetaminophen (Tylenol) or ibuprofen (Advil) as needed. 2. Take your usually prescribed medications unless otherwise directed. If you need a refill on your pain medication, please contact your pharmacy.  They will contact our office to request authorization. Prescriptions will not be filled after 5 pm or on week-ends. 3. You should follow a light diet the first 24 hours after arrival home, such as soup and crackers, etc.  Be sure to include lots of fluids daily.  Resume your normal diet the day after surgery. 4.Most patients will experience some swelling and bruising around the umbilicus or in the groin and scrotum.  Ice packs and reclining will help.  Swelling and bruising can take several days to resolve.  6. It is common to experience some constipation if taking pain medication after surgery.  Increasing fluid intake and taking a stool softener (such as Colace) will usually help or prevent this problem from occurring.  A mild laxative (Milk of Magnesia or Miralax) should be taken according to package directions if there are no bowel movements after 48 hours. 7. Unless discharge instructions indicate otherwise, you may remove your bandages 24-48 hours after surgery, and you may shower at that time.  You may have steri-strips (small skin tapes) in place directly over the incision.  These strips should be left on the skin for 7-10 days.  If your surgeon used skin glue on the  incision, you may shower in 24 hours.  The glue will flake off over the next 2-3 weeks.  Any sutures or staples will be removed at the office during your follow-up visit. 8. ACTIVITIES:  You may resume regular (light) daily activities beginning the next day--such as daily self-care, walking, climbing stairs--gradually increasing activities as tolerated.  You may have sexual intercourse when it is comfortable.  Refrain from any heavy lifting or straining until approved by your doctor.  a.You may drive when you are no longer taking prescription pain medication, you can comfortably wear a seatbelt, and you can safely maneuver your car and apply brakes. b.RETURN TO WORK:   _____________________________________________  9.You should see your doctor in the office for a follow-up appointment approximately 2-3 weeks after your surgery.  Make sure that you call for this appointment within a day or two after you arrive home to insure a convenient appointment time. 10.OTHER INSTRUCTIONS: _OK TO SHOWER STARTING TOMORROW NO LIFTING MORE THAN 15 POUNDS FOR 4 WEEKS ICE PACK, TYLENOL, AND IBUPROFEN ALSO FOR PAIN________________________    _____________________________________  WHEN TO CALL YOUR DOCTOR: 1. Fever over 101.0 2. Inability to urinate 3. Nausea and/or vomiting 4. Extreme swelling or bruising 5. Continued bleeding from incision. 6. Increased pain, redness, or drainage from the incision  The clinic staff is available to answer your questions during regular business hours.  Please dont hesitate to call and ask to speak to one of the nurses for clinical concerns.  If you have a medical emergency, go to the nearest  emergency room or call 911.  A surgeon from [facility] is always on call at the hospital   [address], [address], [place], [place]  [address] ?  [address] [address], [place], [place]   [address] [phone] ? [phone] ? FAX [phone] Web site:  [email or web address]   Post Anesthesia Home Care Instructions  Activity: Get plenty of rest for the remainder of the day. A responsible individual must stay with you for 24 hours following the procedure.  For the next 24 hours, DO NOT: -Drive a car -Advertising copywriter -Drink alcoholic beverages -Take any medication unless instructed by your physician -Make any legal decisions or sign important papers.  Meals: Start with liquid foods such as gelatin or soup. Progress to regular foods as tolerated. Avoid greasy, spicy, heavy foods. If nausea and/or vomiting occur, drink only clear liquids until the nausea and/or vomiting subsides. Call your physician if vomiting continues.  Special Instructions/Symptoms: Your throat may feel dry or sore from the anesthesia or the breathing tube placed in your throat during surgery. If this causes discomfort, gargle with warm salt water. The discomfort should disappear within 24 hours.  *May have Tylenol after 1:30pm today 05/29/2020

## 2020-05-29 NOTE — Interval H&P Note (Signed)
History and Physical Interval Note: no change in H and P  05/29/2020 7:13 AM  Vincent Vargas  has presented today for surgery, with the diagnosis of LEFT INGUINAL HERNIA.  The various methods of treatment have been discussed with the patient and family. After consideration of risks, benefits and other options for treatment, the patient has consented to  Procedure(s) with comments: OPEN LEFT INGUINAL HERNIA REPAIR WITH MESH (Left) - LMA , TAP BLOCK as a surgical intervention.  The patient's history has been reviewed, patient examined, no change in status, stable for surgery.  I have reviewed the patient's chart and labs.  Questions were answered to the patient's satisfaction.     [name]

## 2020-05-29 NOTE — Op Note (Signed)
OPEN LEFT INGUINAL HERNIA REPAIR WITH MESH, INSERTION OF MESH  Procedure Note  Vincent Vargas 05/29/2020   Pre-op Diagnosis: LEFT INGUINAL HERNIA     Post-op Diagnosis: same  Procedure(s): OPEN LEFT INGUINAL HERNIA REPAIR WITH MESH INSERTION OF MESH  Surgeon(s): [name], MD  Anesthesia: General  Staff:  Circulator: Maryan Rued, RN Relief Circulator: Randalyn Rhea, RN Scrub Person: Verdie Drown Circulator Assistant: Linus Salmons, RN  Estimated Blood Loss: Minimal                Findings: The patient was found to have an indirect left inguinal hernia which was repaired with a large piece of Prolene progrip mesh from [employer]  Procedure: The patient was brought to the operating room identifies correct patient.  He was placed upon the operating table general anesthesia was induced.  His left lower quadrant was then prepped and draped in usual sterile fashion.  I anesthetized skin of the inguinal area with Marcaine and then made a longitudinal incision with a scalpel.  I dissected down through Scarpa's fascia with the electrocautery.  I was well inferior to the stimulator wires.  I then opened the external beak fascia toward the internal/external ring.  I then controlled the testicular cord and structures with a Penrose drain.  The patient had an indirect hernia sac and a large lipoma of the cord.  I excised the lipoma.  I separated the cord structures from the hernia sac.  All contents have been reduced.  I tied off the base of the sac with a 3-0 silk suture.  I also had to control bleeding from a small vessel along the cord with the 3-0 silk suture as well.  Next a piece of ProGrip Prolene mesh was brought to the field.  A large piece was used.  It was placed against the pubic tubercle and brought around the cord structures covering the internal ring and inguinal floor.  I sutured the mesh to the pubic tubercle with a 2-0 Vicryl suture.  Wide coverage of  the inguinal floor appeared to be achieved as well as the internal ring.  I then closed the external beak fascia over the top of the mesh with a running 2-0 Vicryl suture.  I anesthetized the surrounding area further with Marcaine.  I then closed the subtenons tissue with interrupted 3-0 Vicryl sutures and closed the skin with running 4-0 Monocryl.  Dermabond was then applied.  The patient tolerated the procedure well.  All the counts were correct at the end of the procedure.  Patient was then extubated in the operating room and taken in stable condition to the recovery room.          [name]   Date: 05/29/2020  Time: 8:56 AM

## 2020-05-29 NOTE — Anesthesia Procedure Notes (Signed)
Procedure Name: LMA Insertion Date/Time: 05/29/2020 8:21 AM Performed by: Salomon Mast, CRNA Pre-anesthesia Checklist: Patient identified, Emergency Drugs available, Suction available and Patient being monitored Patient Re-evaluated:Patient Re-evaluated prior to induction Oxygen Delivery Method: Circle system utilized Preoxygenation: Pre-oxygenation with 100% oxygen Induction Type: IV induction Ventilation: Mask ventilation without difficulty LMA: LMA inserted LMA Size: 4.0 Number of attempts: 1 Placement Confirmation: positive ETCO2 and breath sounds checked- equal and bilateral Tube secured with: Tape Dental Injury: Teeth and Oropharynx as per pre-operative assessment

## 2020-05-30 NOTE — Addendum Note (Signed)
Addendum  created 05/30/20 1326 by Khara Renaud, Jewel Baize, CRNA   Charge Capture section accepted

## 2020-05-31 ENCOUNTER — Encounter (HOSPITAL_BASED_OUTPATIENT_CLINIC_OR_DEPARTMENT_OTHER): Payer: Self-pay | Admitting: Surgery

## 2021-07-26 ENCOUNTER — Other Ambulatory Visit: Payer: Self-pay | Admitting: Surgery

## 2021-07-26 DIAGNOSIS — R1032 Left lower quadrant pain: Secondary | ICD-10-CM

## 2021-08-05 ENCOUNTER — Ambulatory Visit
Admission: RE | Admit: 2021-08-05 | Discharge: 2021-08-05 | Disposition: A | Discharge status: Home / Self Care | Payer: BC Managed Care – PPO | Source: Ambulatory Visit | Attending: Surgery | Admitting: Surgery

## 2021-08-05 DIAGNOSIS — R1032 Left lower quadrant pain: Secondary | ICD-10-CM

## 2021-11-24 HISTORY — PX: ANKLE SURGERY: SHX546

## 2022-01-28 ENCOUNTER — Other Ambulatory Visit: Payer: Self-pay

## 2022-01-28 ENCOUNTER — Encounter (HOSPITAL_BASED_OUTPATIENT_CLINIC_OR_DEPARTMENT_OTHER): Payer: Self-pay | Admitting: *Deleted

## 2022-01-28 ENCOUNTER — Emergency Department (HOSPITAL_BASED_OUTPATIENT_CLINIC_OR_DEPARTMENT_OTHER)
Admission: EM | Admit: 2022-01-28 | Discharge: 2022-01-28 | Disposition: A | Discharge status: Home / Self Care | Payer: BC Managed Care – PPO | Attending: Emergency Medicine | Admitting: Emergency Medicine

## 2022-01-28 ENCOUNTER — Emergency Department (HOSPITAL_BASED_OUTPATIENT_CLINIC_OR_DEPARTMENT_OTHER): Payer: BC Managed Care – PPO

## 2022-01-28 DIAGNOSIS — E876 Hypokalemia: Secondary | ICD-10-CM | POA: Insufficient documentation

## 2022-01-28 DIAGNOSIS — R8289 Other abnormal findings on cytological and histological examination of urine: Secondary | ICD-10-CM | POA: Diagnosis not present

## 2022-01-28 DIAGNOSIS — Z7984 Long term (current) use of oral hypoglycemic drugs: Secondary | ICD-10-CM | POA: Insufficient documentation

## 2022-01-28 DIAGNOSIS — R109 Unspecified abdominal pain: Secondary | ICD-10-CM | POA: Diagnosis present

## 2022-01-28 DIAGNOSIS — N132 Hydronephrosis with renal and ureteral calculous obstruction: Secondary | ICD-10-CM | POA: Diagnosis not present

## 2022-01-28 DIAGNOSIS — E1165 Type 2 diabetes mellitus with hyperglycemia: Secondary | ICD-10-CM | POA: Diagnosis not present

## 2022-01-28 DIAGNOSIS — E8809 Other disorders of plasma-protein metabolism, not elsewhere classified: Secondary | ICD-10-CM | POA: Insufficient documentation

## 2022-01-28 DIAGNOSIS — D72829 Elevated white blood cell count, unspecified: Secondary | ICD-10-CM | POA: Diagnosis not present

## 2022-01-28 DIAGNOSIS — R110 Nausea: Secondary | ICD-10-CM | POA: Insufficient documentation

## 2022-01-28 DIAGNOSIS — Z79899 Other long term (current) drug therapy: Secondary | ICD-10-CM | POA: Insufficient documentation

## 2022-01-28 DIAGNOSIS — I10 Essential (primary) hypertension: Secondary | ICD-10-CM | POA: Insufficient documentation

## 2022-01-28 DIAGNOSIS — N200 Calculus of kidney: Secondary | ICD-10-CM

## 2022-01-28 LAB — URINALYSIS, ROUTINE W REFLEX MICROSCOPIC
Bilirubin Urine: NEGATIVE
Glucose, UA: 100 mg/dL — AB
Ketones, ur: NEGATIVE mg/dL
Leukocytes,Ua: NEGATIVE
Nitrite: NEGATIVE
Protein, ur: NEGATIVE mg/dL
Specific Gravity, Urine: 1.02 (ref 1.005–1.030)
pH: 7 (ref 5.0–8.0)

## 2022-01-28 LAB — URINALYSIS, MICROSCOPIC (REFLEX)

## 2022-01-28 LAB — COMPREHENSIVE METABOLIC PANEL
ALT: 26 U/L (ref 0–44)
AST: 23 U/L (ref 15–41)
Albumin: 4.9 g/dL (ref 3.5–5.0)
Alkaline Phosphatase: 84 U/L (ref 38–126)
Anion gap: 14 (ref 5–15)
BUN: 11 mg/dL (ref 6–20)
CO2: 25 mmol/L (ref 22–32)
Calcium: 9.8 mg/dL (ref 8.9–10.3)
Chloride: 99 mmol/L (ref 98–111)
Creatinine, Ser: 0.9 mg/dL (ref 0.61–1.24)
GFR, Estimated: >60 mL/min (ref >60)
Glucose, Bld: 143 mg/dL — ABNORMAL HIGH (ref 70–99)
Potassium: 3.4 mmol/L — ABNORMAL LOW (ref 3.5–5.1)
Sodium: 138 mmol/L (ref 135–145)
Total Bilirubin: 1.1 mg/dL (ref 0.3–1.2)
Total Protein: 8.2 g/dL — ABNORMAL HIGH (ref 6.5–8.1)

## 2022-01-28 LAB — CBC WITH DIFFERENTIAL/PLATELET
Abs Immature Granulocytes: 0.02 10*3/uL (ref 0.00–0.07)
Basophils Absolute: 0 10*3/uL (ref 0.0–0.1)
Basophils Relative: 0 %
Eosinophils Absolute: 0 10*3/uL (ref 0.0–0.5)
Eosinophils Relative: 0 %
HCT: 41.1 % (ref 39.0–52.0)
Hemoglobin: 14.2 g/dL (ref 13.0–17.0)
Immature Granulocytes: 0 %
Lymphocytes Relative: 26 %
Lymphs Abs: 2.3 10*3/uL (ref 0.7–4.0)
MCH: 31.3 pg (ref 26.0–34.0)
MCHC: 34.5 g/dL (ref 30.0–36.0)
MCV: 90.7 fL (ref 80.0–100.0)
Monocytes Absolute: 0.9 10*3/uL (ref 0.1–1.0)
Monocytes Relative: 10 %
Neutro Abs: 5.6 10*3/uL (ref 1.7–7.7)
Neutrophils Relative %: 64 %
Platelets: 299 10*3/uL (ref 150–400)
RBC: 4.53 MIL/uL (ref 4.22–5.81)
RDW: 12.5 % (ref 11.5–15.5)
WBC: 8.9 10*3/uL (ref 4.0–10.5)
nRBC: 0 % (ref 0.0–0.2)

## 2022-01-28 MED ORDER — ONDANSETRON HCL 4 MG/2ML IJ SOLN
4.0000 mg | Freq: Once | INTRAMUSCULAR | Status: AC
  Administered 2022-01-28: 4 mg via INTRAVENOUS
  Filled 2022-01-28: qty 2

## 2022-01-28 MED ORDER — KETOROLAC TROMETHAMINE 60 MG/2ML IM SOLN
60.0000 mg | Freq: Once | INTRAMUSCULAR | Status: AC
  Administered 2022-01-28: 60 mg via INTRAMUSCULAR
  Filled 2022-01-28: qty 2

## 2022-01-28 MED ORDER — TAMSULOSIN HCL 0.4 MG PO CAPS: 0.4000 mg | ORAL_CAPSULE | Freq: Every day | ORAL | 0 refills | Status: AC

## 2022-01-28 MED ORDER — SODIUM CHLORIDE 0.9 % IV BOLUS
500.0000 mL | Freq: Once | INTRAVENOUS | Status: AC
  Administered 2022-01-28: 500 mL via INTRAVENOUS

## 2022-01-28 MED ORDER — HYDROMORPHONE HCL 1 MG/ML IJ SOLN
1.0000 mg | Freq: Once | INTRAMUSCULAR | Status: AC
  Administered 2022-01-28: 1 mg via INTRAVENOUS
  Filled 2022-01-28: qty 1

## 2022-01-28 NOTE — ED Notes (Signed)
Patient transported to CT 

## 2022-01-28 NOTE — Discharge Instructions (Addendum)
I would recommend you take ibuprofen as needed for your pain throughout the day.  If you find you are not getting adequate relief, please continue to take your prescribed Vicodin as needed. ? ?I am also prescribing you a medication called Flomax.  This will increase your urinary frequency and hopefully help you pass the stone faster.  Please take this once per day in the morning.  Once you have passed the stone you no longer need to take this medicine. ? ?Please consider calling [facility] and following up regarding your recurrent kidney stones as well as your symptoms today. ? ?If you develop any new or worsening symptoms please come back to the emergency department immediately. ? ?

## 2022-01-28 NOTE — ED Notes (Signed)
States he is unable to urinate.

## 2022-01-28 NOTE — ED Provider Notes (Signed)
Vincent Vargas   CSN: [id number] Arrival date & time: 01/28/22  1918     History  Chief Complaint  Patient presents with   Flank Pain    Vincent Vargas is a 56 y.o. male.  HPI Patient is a 56 year old male with a history of hypertension, hyperlipidemia, diabetes mellitus, kidney stones, who presents to the emergency department due to left-sided abdominal pain and left flank pain that began around 8 AM this morning.  States his symptoms were initially severe but then spontaneously resolved.  He has continued to have intermittent symptoms throughout the day that recently worsened once again.  States that he has been urinating throughout the day and denies any dysuria.  Reports some mild nausea but no vomiting or diarrhea.  Reports a history of kidney stones but states that this pain is more severe.  Denies any chest pain or shortness of breath.    Home Medications Prior to Admission medications   Medication Sig Start Date End Date Taking? Authorizing Provider  tamsulosin (FLOMAX) 0.4 MG CAPS capsule Take 1 capsule (0.4 mg total) by mouth daily after breakfast for 7 days. 01/28/22 02/04/22 Yes Yashar Inclan, PA-C  amLODipine (NORVASC) 5 MG tablet TAKE 1 TABLET BY MOUTH EVERY DAY 05/09/16   [provider]  atorvastatin (LIPITOR) 10 MG tablet Take 20 mg by mouth.  12/14/18   [provider]  cloNIDine (CATAPRES) 0.1 MG tablet Take 0.1 mg by mouth 2 (two) times daily.    [provider]  Continuous Blood Gluc Receiver (FREESTYLE LIBRE READER) DEVI by Does not apply route. 04/15/19   [provider]  Continuous Blood Gluc Sensor ([employer]) MISC by Does not apply route. 04/15/19   [provider]  empagliflozin (JARDIANCE) 25 MG TABS tablet Take 25 mg by mouth daily.    [provider]  gabapentin (NEURONTIN) 600 MG tablet Take 1,200 mg by mouth 3 (three) times daily.  06/10/19  05/21/20  [provider]  glipiZIDE (GLUCOTROL XL) 10 MG 24 hr tablet Take by mouth. 03/14/19   [provider]  lisinopril (ZESTRIL) 5 MG tablet Take 10 mg by mouth.  05/09/16   [provider]  metFORMIN (GLUCOPHAGE-XR) 500 MG 24 hr tablet Take 1,000 mg by mouth in the morning and at bedtime.  12/10/15   [provider]  methocarbamol (ROBAXIN) 500 MG tablet Take 1,000 mg by mouth in the morning and at bedtime.    [provider]  oxyCODONE (OXY IR/ROXICODONE) 5 MG immediate release tablet Take 1 tablet (5 mg total) by mouth every 6 (six) hours as needed for moderate pain, severe pain or breakthrough pain. 05/29/20   Coralie Keens, MD  sildenafil (VIAGRA) 100 MG tablet Take 100 mg by mouth daily as needed for erectile dysfunction.    [provider]  tiZANidine (ZANAFLEX) 4 MG capsule Take 8 mg by mouth at bedtime.    [provider]      Allergies    Varenicline and Toprol xl [metoprolol tartrate]    Review of Systems   Review of Systems  All other systems reviewed and are negative. Ten systems reviewed and are negative for acute change, except as noted in the HPI.   Physical Exam Updated Vital Signs BP (!) 158/92    Pulse 84    Temp 98.2 F (36.8 C) (Oral)    Resp 16    Ht 5\' 9"  (1.753 m)  Wt 80.3 kg    SpO2 99%    BMI 26.14 kg/m  Physical Exam Vitals and nursing Vargas reviewed.  Constitutional:      General: He is not in acute distress.    Appearance: Normal appearance. He is not ill-appearing, toxic-appearing or diaphoretic.  HENT:     Head: Normocephalic and atraumatic.     Right Ear: External ear normal.     Left Ear: External ear normal.     Nose: Nose normal.     Mouth/Throat:     Mouth: Mucous membranes are moist.     Pharynx: Oropharynx is clear. No oropharyngeal exudate or posterior oropharyngeal erythema.  Eyes:     Extraocular Movements: Extraocular movements intact.  Cardiovascular:     Rate and  Rhythm: Normal rate and regular rhythm.     Pulses: Normal pulses.     Heart sounds: Normal heart sounds. No murmur heard.   No friction rub. No gallop.  Pulmonary:     Effort: Pulmonary effort is normal. No respiratory distress.     Breath sounds: Normal breath sounds. No stridor. No wheezing, rhonchi or rales.  Abdominal:     General: Abdomen is flat.     Palpations: Abdomen is soft.     Tenderness: There is abdominal tenderness.     Comments: Abdomen is flat and soft.  Moderate tenderness noted diffusely across the left side of the abdomen that appears to be worst in the left lower quadrant.  Additional mild left CVA tenderness.  No right CVA tenderness.  Musculoskeletal:        General: Normal range of motion.     Cervical back: Normal range of motion and neck supple. No tenderness.  Skin:    General: Skin is warm and dry.  Neurological:     General: No focal deficit present.     Mental Status: He is alert and oriented to person, place, and time.  Psychiatric:        Mood and Affect: Mood normal.        Behavior: Behavior normal.   ED Results / Procedures / Treatments   Labs (all labs ordered are listed, but only abnormal results are displayed) Labs Reviewed  URINALYSIS, ROUTINE W REFLEX MICROSCOPIC - Abnormal; Notable for the following components:      Result Value   Glucose, UA 100 (*)    Hgb urine dipstick SMALL (*)    All other components within normal limits  COMPREHENSIVE METABOLIC PANEL - Abnormal; Notable for the following components:   Potassium 3.4 (*)    Glucose, Bld 143 (*)    Total Protein 8.2 (*)    All other components within normal limits  URINALYSIS, MICROSCOPIC (REFLEX) - Abnormal; Notable for the following components:   Bacteria, UA RARE (*)    All other components within normal limits  CBC WITH DIFFERENTIAL/PLATELET   EKG None  Radiology CT Renal Stone Study  Result Date: 01/28/2022 CLINICAL DATA:  Left flank pain EXAM: CT ABDOMEN AND PELVIS  WITHOUT CONTRAST TECHNIQUE: Multidetector CT imaging of the abdomen and pelvis was performed following the standard protocol without IV contrast. RADIATION DOSE REDUCTION: This exam was performed according to the departmental dose-optimization program which includes automated exposure control, adjustment of the mA and/or kV according to patient size and/or use of iterative reconstruction technique. COMPARISON:  03/12/2012 FINDINGS: Lower chest: Lung bases demonstrate no acute consolidation or effusion. Coronary vascular calcification. Hepatobiliary: No focal liver abnormality is seen. No gallstones, gallbladder wall  thickening, or biliary dilatation. Pancreas: Unremarkable. No pancreatic ductal dilatation or surrounding inflammatory changes. Spleen: Normal in size without focal abnormality. Adrenals/Urinary Tract: Adrenal glands are normal. Mild left hydronephrosis and hydroureter, secondary to a 2-3 mm stone in the distal left ureter at or near the UVJ. The bladder is unremarkable Stomach/Bowel: Stomach is within normal limits. Appendix appears normal. No evidence of bowel wall thickening, distention, or inflammatory changes. Diverticular disease of the left colon. Vascular/Lymphatic: Mild atherosclerosis of the aorta. No aneurysm. No suspicious lymph nodes Reproductive: Prostate is unremarkable. Other: Negative for pelvic effusion or free air Musculoskeletal: No acute or significant osseous findings. Chronic bilateral pars defect at L5. Ascending thoracic stimulator leads terminate posterior at the level of T10. IMPRESSION: 1. Mild left hydronephrosis and hydroureter secondary to a 2-3 mm stone in the distal left ureter at the left UVJ 2. Mild diverticular disease of the left colon without acute wall thickening Electronically Signed   By: Jasmine Pang M.D.   On: 01/28/2022 20:35    Procedures Procedures   Medications Ordered in ED Medications  sodium chloride 0.9 % bolus 500 mL (0 mLs Intravenous Stopped  01/28/22 2103)  HYDROmorphone (DILAUDID) injection 1 mg (1 mg Intravenous Given 01/28/22 1954)  ondansetron (ZOFRAN) injection 4 mg (4 mg Intravenous Given 01/28/22 1954)  ketorolac (TORADOL) injection 60 mg (60 mg Intramuscular Given 01/28/22 2049)    ED Course/ Medical Decision Making/ A&P                           Medical Decision Making Amount and/or Complexity of Data Reviewed Labs: ordered. Radiology: ordered.  Risk Prescription drug management.  Pt is a 56 y.o. male with a h/o kidney stones that presents due to intermittent LLQ and left flank pain that began this morning.  Labs: CBC without abnormalities. CMP with a potassium of 3.4, glucose 143 and protein of 8.2. UA with small hemoglobin, 100 glucose, 6-10 RBCs, and rare bacteria.  Imaging: CT renal stone study shows IMPRESSION: 1. Mild left hydronephrosis and hydroureter secondary to a 2-3 mm stone in the distal left ureter at the left UVJ 2. Mild diverticular disease of the left colon without acute wall thickening Electronically Signed   By: Jasmine Pang M.D.   On: 01/28/2022 20:35    I, Placido Sou, PA-C, personally reviewed and evaluated these images and lab results as part of my medical decision-making.  History and exam appears consistent with kidney stone. Renal stone study obtained showing a 2-3 mm stone at the left UVJ with mild hydronephrosis and hydroureter. Patient's sx treated with IV dilaudid, IVF, IV zofran and IM toradol. He notes complete resolution of his symptoms. He is currently pain free on reassessment.   Labs appear generally reassuring. CBC with leukocytosis. UA with rare bacteria but no nitrites, leukocytes and 0-5 WBCs. Does not appears infectious. Afebrile, non tachycardic and non toxic appearing.   Feel the patient is stable for DC at this time and he is agreeable. He has prescriptions for vicodin as well as zofran and discussed taking these as needed. Recommended ibuprofen for management of his pain  and vicodin only for breakthrough symptoms. Recommended urology follow up. Discussed return precautions. His questions were answered and he was amicable at the time of DC.  Vargas: Portions of this report may have been transcribed using voice recognition software. Every effort was made to ensure accuracy; however, inadvertent computerized transcription errors may be present.  Final Clinical Impression(s) / ED Diagnoses Final diagnoses:  Kidney stone   Rx / DC Orders ED Discharge Orders          Ordered    tamsulosin (FLOMAX) 0.4 MG CAPS capsule  Daily after breakfast        01/28/22 2229              Rayna Sexton, PA-C 01/29/22 1014    Malvin Johns, MD 02/01/22 1501

## 2022-01-28 NOTE — ED Triage Notes (Signed)
Left flank pain into his LLQ since this am. He was seen at [facility] and told he may have a kidney stone and to come to the ER. Pt has been unable to urinate. ?

## 2022-06-08 ENCOUNTER — Emergency Department (HOSPITAL_COMMUNITY)
Admission: EM | Admit: 2022-06-08 | Discharge: 2022-06-08 | Disposition: A | Discharge status: Home / Self Care | Payer: BC Managed Care – PPO | Attending: Emergency Medicine | Admitting: Emergency Medicine

## 2022-06-08 ENCOUNTER — Encounter (HOSPITAL_COMMUNITY): Payer: Self-pay

## 2022-06-08 ENCOUNTER — Emergency Department (HOSPITAL_COMMUNITY): Payer: BC Managed Care – PPO

## 2022-06-08 DIAGNOSIS — K5900 Constipation, unspecified: Secondary | ICD-10-CM | POA: Diagnosis not present

## 2022-06-08 DIAGNOSIS — R109 Unspecified abdominal pain: Secondary | ICD-10-CM

## 2022-06-08 DIAGNOSIS — Z7984 Long term (current) use of oral hypoglycemic drugs: Secondary | ICD-10-CM | POA: Insufficient documentation

## 2022-06-08 DIAGNOSIS — R1031 Right lower quadrant pain: Secondary | ICD-10-CM | POA: Diagnosis present

## 2022-06-08 LAB — CBC WITH DIFFERENTIAL/PLATELET
Abs Immature Granulocytes: 0.03 10*3/uL (ref 0.00–0.07)
Basophils Absolute: 0 10*3/uL (ref 0.0–0.1)
Basophils Relative: 1 %
Eosinophils Absolute: 0 10*3/uL (ref 0.0–0.5)
Eosinophils Relative: 1 %
HCT: 35.5 % — ABNORMAL LOW (ref 39.0–52.0)
Hemoglobin: 11.9 g/dL — ABNORMAL LOW (ref 13.0–17.0)
Immature Granulocytes: 1 %
Lymphocytes Relative: 28 %
Lymphs Abs: 1.9 10*3/uL (ref 0.7–4.0)
MCH: 31.4 pg (ref 26.0–34.0)
MCHC: 33.5 g/dL (ref 30.0–36.0)
MCV: 93.7 fL (ref 80.0–100.0)
Monocytes Absolute: 0.5 10*3/uL (ref 0.1–1.0)
Monocytes Relative: 8 %
Neutro Abs: 4.2 10*3/uL (ref 1.7–7.7)
Neutrophils Relative %: 61 %
Platelets: 264 10*3/uL (ref 150–400)
RBC: 3.79 MIL/uL — ABNORMAL LOW (ref 4.22–5.81)
RDW: 12.5 % (ref 11.5–15.5)
WBC: 6.6 10*3/uL (ref 4.0–10.5)
nRBC: 0 % (ref 0.0–0.2)

## 2022-06-08 LAB — COMPREHENSIVE METABOLIC PANEL
ALT: 23 U/L (ref 0–44)
AST: 15 U/L (ref 15–41)
Albumin: 4.3 g/dL (ref 3.5–5.0)
Alkaline Phosphatase: 70 U/L (ref 38–126)
Anion gap: 9 (ref 5–15)
BUN: 13 mg/dL (ref 6–20)
CO2: 28 mmol/L (ref 22–32)
Calcium: 9.3 mg/dL (ref 8.9–10.3)
Chloride: 102 mmol/L (ref 98–111)
Creatinine, Ser: 0.88 mg/dL (ref 0.61–1.24)
GFR, Estimated: >60 mL/min (ref >60)
Glucose, Bld: 159 mg/dL — ABNORMAL HIGH (ref 70–99)
Potassium: 3.9 mmol/L (ref 3.5–5.1)
Sodium: 139 mmol/L (ref 135–145)
Total Bilirubin: 0.7 mg/dL (ref 0.3–1.2)
Total Protein: 7 g/dL (ref 6.5–8.1)

## 2022-06-08 LAB — URINALYSIS, ROUTINE W REFLEX MICROSCOPIC
Bacteria, UA: NONE SEEN
Bilirubin Urine: NEGATIVE
Glucose, UA: >=500 mg/dL — AB
Hgb urine dipstick: NEGATIVE
Ketones, ur: NEGATIVE mg/dL
Leukocytes,Ua: NEGATIVE
Nitrite: NEGATIVE
Protein, ur: NEGATIVE mg/dL
Specific Gravity, Urine: 1.036 — ABNORMAL HIGH (ref 1.005–1.030)
pH: 5 (ref 5.0–8.0)

## 2022-06-08 LAB — LIPASE, BLOOD: Lipase: 37 U/L (ref 11–51)

## 2022-06-08 MED ORDER — IOHEXOL 300 MG/ML  SOLN
75.0000 mL | Freq: Once | INTRAMUSCULAR | Status: AC | PRN
  Administered 2022-06-08: 75 mL via INTRAVENOUS

## 2022-06-08 MED ORDER — POLYETHYLENE GLYCOL 3350 17 GM/SCOOP PO POWD: 1.0000 | Freq: Once | ORAL | 0 refills | Status: AC

## 2022-06-08 MED ORDER — ONDANSETRON HCL 4 MG/2ML IJ SOLN
4.0000 mg | Freq: Once | INTRAMUSCULAR | Status: AC
  Administered 2022-06-08: 4 mg via INTRAVENOUS
  Filled 2022-06-08: qty 2

## 2022-06-08 MED ORDER — HYDROMORPHONE HCL 1 MG/ML IJ SOLN
1.0000 mg | Freq: Once | INTRAMUSCULAR | Status: AC
  Administered 2022-06-08: 1 mg via INTRAVENOUS
  Filled 2022-06-08: qty 1

## 2022-06-08 MED ORDER — OXYCODONE-ACETAMINOPHEN 5-325 MG PO TABS
2.0000 | ORAL_TABLET | Freq: Once | ORAL | Status: AC
  Administered 2022-06-08: 2 via ORAL
  Filled 2022-06-08: qty 2

## 2022-06-08 MED ORDER — HYDROMORPHONE HCL 1 MG/ML IJ SOLN
1.0000 mg | Freq: Once | INTRAMUSCULAR | Status: AC
Start: 2022-06-08 — End: 2022-06-08
  Administered 2022-06-08: 1 mg via INTRAVENOUS
  Filled 2022-06-08: qty 1

## 2022-06-08 MED ORDER — IOHEXOL 300 MG/ML  SOLN
100.0000 mL | Freq: Once | INTRAMUSCULAR | Status: AC | PRN
  Administered 2022-06-08: 100 mL via INTRAVENOUS

## 2022-06-08 NOTE — ED Triage Notes (Signed)
Pt states that he has been RUQ abd pain for the past few hour, denies n/v/d

## 2022-06-08 NOTE — ED Provider Notes (Signed)
[facility] EMERGENCY DEPARTMENT Provider Note   CSN: [id number] Arrival date & time: 06/08/22  0043     History  Chief Complaint  Patient presents with   Abdominal Pain    Vincent Vargas is a 56 y.o. male presenting to the abdominal pain.  The patient reports he had onset of right lower quadrant abdominal pain yesterday, continuous and worsening into today.  He reports nausea but denies vomiting.  He denies diarrhea.  He has never had this pain before.  He does have a history of kidney stones, denies that this pain is similar.  He is also began treatment for H. pylori infection on antibiotics,  HPI     Home Medications Prior to Admission medications   Medication Sig Start Date End Date Taking? Authorizing Provider  polyethylene glycol powder (GLYCOLAX/MIRALAX) 17 GM/SCOOP powder Take 255 g by mouth once for 1 dose. 06/08/22 06/08/22 Yes Jarrah Babich, Kermit Balo, MD  amLODipine (NORVASC) 5 MG tablet TAKE 1 TABLET BY MOUTH EVERY DAY 05/09/16   [provider]  atorvastatin (LIPITOR) 10 MG tablet Take 20 mg by mouth.  12/14/18   [provider]  cloNIDine (CATAPRES) 0.1 MG tablet Take 0.1 mg by mouth 2 (two) times daily.    [provider]  Continuous Blood Gluc Receiver (FREESTYLE LIBRE READER) DEVI by Does not apply route. 04/15/19   [provider]  Continuous Blood Gluc Sensor (FREESTYLE LIBRE SENSOR SYSTEM) MISC by Does not apply route. 04/15/19   [provider]  empagliflozin (JARDIANCE) 25 MG TABS tablet Take 25 mg by mouth daily.    [provider]  gabapentin (NEURONTIN) 600 MG tablet Take 1,200 mg by mouth 3 (three) times daily.  06/10/19 05/21/20  [provider]  glipiZIDE (GLUCOTROL XL) 10 MG 24 hr tablet Take by mouth. 03/14/19   [provider]  lisinopril (ZESTRIL) 5 MG tablet Take 10 mg by mouth.  05/09/16   [provider]  metFORMIN (GLUCOPHAGE-XR) 500 MG 24 hr tablet Take 1,000 mg  by mouth in the morning and at bedtime.  12/10/15   [provider]  methocarbamol (ROBAXIN) 500 MG tablet Take 1,000 mg by mouth in the morning and at bedtime.    [provider]  oxyCODONE (OXY IR/ROXICODONE) 5 MG immediate release tablet Take 1 tablet (5 mg total) by mouth every 6 (six) hours as needed for moderate pain, severe pain or breakthrough pain. 05/29/20   [name], MD  sildenafil (VIAGRA) 100 MG tablet Take 100 mg by mouth daily as needed for erectile dysfunction.    [provider]  tiZANidine (ZANAFLEX) 4 MG capsule Take 8 mg by mouth at bedtime.    [provider]      Allergies    Varenicline and Toprol xl [metoprolol tartrate]    Review of Systems   Review of Systems  Physical Exam Updated Vital Signs BP 115/62 (BP Location: Right Arm)   Pulse 97   Temp 97.8 F (36.6 C) (Oral)   Resp 16   SpO2 100%  Physical Exam Constitutional:      General: He is not in acute distress. HENT:     Head: Normocephalic and atraumatic.  Eyes:     Conjunctiva/sclera: Conjunctivae normal.     Pupils: Pupils are equal, round, and reactive to light.  Cardiovascular:     Rate and Rhythm: Normal rate and regular rhythm.  Pulmonary:     Effort: Pulmonary effort is normal. No respiratory  distress.  Abdominal:     General: There is no distension.     Tenderness: There is abdominal tenderness in the right lower quadrant. There is guarding and rebound. Negative signs include Murphy's sign.  Skin:    General: Skin is warm and dry.  Neurological:     General: No focal deficit present.     Mental Status: He is alert. Mental status is at baseline.  Psychiatric:        Mood and Affect: Mood normal.        Behavior: Behavior normal.     ED Results / Procedures / Treatments   Labs (all labs ordered are listed, but only abnormal results are displayed) Labs Reviewed  CBC WITH DIFFERENTIAL/PLATELET - Abnormal; Notable for the following components:       Result Value   RBC 3.79 (*)    Hemoglobin 11.9 (*)    HCT 35.5 (*)    All other components within normal limits  COMPREHENSIVE METABOLIC PANEL - Abnormal; Notable for the following components:   Glucose, Bld 159 (*)    All other components within normal limits  URINALYSIS, ROUTINE W REFLEX MICROSCOPIC - Abnormal; Notable for the following components:   Specific Gravity, Urine 1.036 (*)    Glucose, UA >=500 (*)    All other components within normal limits  LIPASE, BLOOD    EKG None  Radiology CT ABDOMEN PELVIS W CONTRAST  Result Date: 06/08/2022 CLINICAL DATA:  Right upper quadrant abdominal pain for few hours. EXAM: CT ABDOMEN AND PELVIS WITH CONTRAST TECHNIQUE: Multidetector CT imaging of the abdomen and pelvis was performed using the standard protocol following bolus administration of intravenous contrast. RADIATION DOSE REDUCTION: This exam was performed according to the departmental dose-optimization program which includes automated exposure control, adjustment of the mA and/or kV according to patient size and/or use of iterative reconstruction technique. CONTRAST:  8mL OMNIPAQUE IOHEXOL 300 MG/ML  SOLN COMPARISON:  01/28/2022 unenhanced CT abdomen/pelvis. FINDINGS: Lower chest: No significant pulmonary nodules or acute consolidative airspace disease. Coronary atherosclerosis. Hepatobiliary: Liver size. Subcentimeter hypodense segment 5 right liver lesion is too small to characterize and unchanged, presumably benign. No new liver lesions. Normal gallbladder with no radiopaque cholelithiasis. No biliary ductal dilatation. Pancreas: Normal, with no mass or duct dilation. Spleen: Normal size. No mass. Adrenals/Urinary Tract: Normal adrenals. Simple 1.8 cm posterior lower left renal cyst with additional scattered subcentimeter hypodense renal cortical lesions in both kidneys, too small to characterize, for which no follow-up imaging is recommended. No hydronephrosis. Normal bladder.  Stomach/Bowel: Normal non-distended stomach. Normal caliber small bowel with no small bowel wall thickening. Normal appendix. Minimal sigmoid diverticulosis with no large bowel wall thickening or significant pericolonic fat stranding. Diffuse moderate to large colonic stool volume. Vascular/Lymphatic: Atherosclerotic nonaneurysmal abdominal aorta. Patent portal, splenic, hepatic and renal veins. No pathologically enlarged lymph nodes in the abdomen or pelvis. Reproductive: Normal size prostate. Other: No pneumoperitoneum, ascites or focal fluid collection. Subcutaneous ventral left spinal stimulator device with leads terminating in the posterior lower thoracic spinal canal. Musculoskeletal: No aggressive appearing focal osseous lesions. Mild thoracolumbar spondylosis. Chronic bilateral L5 pars defects. IMPRESSION: 1. No acute abnormality. No evidence of bowel obstruction or acute bowel inflammation. Minimal sigmoid diverticulosis, with no evidence of acute diverticulitis. 2. Diffuse moderate to large colonic stool volume, suggesting constipation. 3. Coronary atherosclerosis. 4. Chronic bilateral L5 pars defects. 5. Aortic Atherosclerosis (ICD10-I70.0). Electronically Signed   By: Delbert Phenix M.D.   On: 06/08/2022 13:39   DG  Abd 2 Views  Result Date: 06/08/2022 CLINICAL DATA:  Right flank pain EXAM: ABDOMEN - 2 VIEW COMPARISON:  03/15/2012 FINDINGS: The bowel gas pattern is normal. There is no evidence of free air. No radio-opaque calculi or other significant radiographic abnormality is seen. Dorsal column stimulator lead device is in place with leads overlying the spinal canal at T11-12. IMPRESSION: Negative. Electronically Signed   By: Helyn Numbers M.D.   On: 06/08/2022 01:35    Procedures Procedures    Medications Ordered in ED Medications  oxyCODONE-acetaminophen (PERCOCET/ROXICET) 5-325 MG per tablet 2 tablet (2 tablets Oral Given 06/08/22 0113)  HYDROmorphone (DILAUDID) injection 1 mg (1 mg  Intravenous Given 06/08/22 1306)  ondansetron (ZOFRAN) injection 4 mg (4 mg Intravenous Given 06/08/22 1307)  iohexol (OMNIPAQUE) 300 MG/ML solution 100 mL (100 mLs Intravenous Contrast Given 06/08/22 1324)  iohexol (OMNIPAQUE) 300 MG/ML solution 75 mL (75 mLs Intravenous Contrast Given 06/08/22 1330)  HYDROmorphone (DILAUDID) injection 1 mg (1 mg Intravenous Given 06/08/22 1359)    ED Course/ Medical Decision Making/ A&P Clinical Course as of 06/08/22 1529  Sun Jun 08, 2022  1441 CT finding consistent with significant constipation, which could correlate with his discomfort and pain.  On further questioning appears he does suffer from constipation frequently.  MiraLAX has worked well in the past and worked well this time.  I have a lower suspicion for acute appendicitis with no fever, no leukocytosis, and normal CT findings.  I would advise a bowel cleanout at home and follow-up with the PCP.  They are content with this plan.  We did discuss that opiates, such as medications given in the ED today, can worsen constipation, they verbalized understanding. [MT]    Clinical Course User Index [MT] Fynn Vanblarcom, Kermit Balo, MD                           Medical Decision Making Amount and/or Complexity of Data Reviewed Radiology: ordered.  Risk Prescription drug management.   This patient presents to the Emergency Department with complaint of abdominal pain. This involves an extensive number of treatment options, and is a complaint that carries with it a high risk of complications and morbidity.  The differential diagnosis includes, but is not limited to, gastritis vs biliary disease vs peptic ulcer vs appendicitis vs ureteral colic vs other  I have relatively high clinical concern for possible appendicitis, as he does have guarding in the right lower quadrant.  He will need CT imaging.  He does not have a peritonitic abdomen.  Vital signs remained stable.  I ordered, reviewed, and interpreted labs, including  BMP and CBC.  There were no immediate, life-threatening emergencies found in this labwork.  Patient's UA showed no signs of infection.  Lipase wnl I ordered medication IV Dilaudid, IV Zofran for abdominal pain and/or nausea I ordered imaging studies which included CT abdomen pelvis I independently visualized and interpreted imaging which showed constipation, no acute appendicitis, or other emergent findings in the abdomen Additional history was obtained from patient's wife at bedside External records reviewed, patient diagnosed and began treatment for H. pylori infection on dual antibiotics and PPI.         Final Clinical Impression(s) / ED Diagnoses Final diagnoses:  Abdominal pain, unspecified abdominal location  Constipation, unspecified constipation type    Rx / DC Orders ED Discharge Orders          Ordered    polyethylene glycol powder (GLYCOLAX/MIRALAX)  17 GM/SCOOP powder   Once        06/08/22 1425              Terald Sleeper, MD 06/08/22 1530

## 2022-06-08 NOTE — ED Provider Triage Note (Signed)
Emergency Medicine Provider Triage Evaluation Note  Vincent Vargas , a 56 y.o. male  was evaluated in triage.  Pt complains of RUQ pain onset a few hours ago. Pain constant, severe. Has had similar pain in the past, but "not this bad". Followed by GI at [facility] and was started on antibiotics and an antacid for "bad bacteria" in the gut following results of EGD at the end of June (H pylori - ?). Last BM yesterday. C/o nausea, but denies vomiting. No urinary symptoms. On chronic hydrocodone through pain management.  Review of Systems  Positive: As above Negative: As above  Physical Exam  BP 111/77   Pulse 84   Temp (!) 97.5 F (36.4 C) (Oral)   Resp 16   SpO2 100%  Gen:   Awake, no distress   Resp:  Normal effort  MSK:   Moves extremities without difficulty  Other:  TTP in the RUQ and RLQ. Negative Murphy's sign. No peritoneal signs.  Medical Decision Making  Medically screening exam initiated at 1:02 AM.  Appropriate orders placed.  Vincent Vargas was informed that the remainder of the evaluation will be completed by another provider, this initial triage assessment does not replace that evaluation, and the importance of remaining in the ED until their evaluation is complete.  Abdominal pain - labs and Xray initiated. CT and EGD results from end of June reviewed on Care Everywhere.   Antony Madura, PA-C 06/08/22 0105

## 2022-06-08 NOTE — Discharge Instructions (Addendum)
I would recommend a bowel clean out.  Start by taking miralax once daily until you have a large bowel movement. If this medicine does not work after 24 hours, try other over-the-counter medications, including glycerin suppository, dulcolax tablets, or other laxatives.

## 2022-06-26 ENCOUNTER — Encounter: Payer: Self-pay | Admitting: *Deleted

## 2022-07-01 ENCOUNTER — Ambulatory Visit (INDEPENDENT_AMBULATORY_CARE_PROVIDER_SITE_OTHER): Payer: BC Managed Care – PPO | Admitting: Diagnostic Neuroimaging

## 2022-07-01 ENCOUNTER — Encounter: Payer: Self-pay | Admitting: Diagnostic Neuroimaging

## 2022-07-01 VITALS — BP 120/82 | HR 105 | Ht 69.0 in | Wt 170.0 lb

## 2022-07-01 DIAGNOSIS — R251 Tremor, unspecified: Secondary | ICD-10-CM | POA: Diagnosis not present

## 2022-07-01 MED ORDER — PROPRANOLOL HCL 20 MG PO TABS: 20.0000 mg | ORAL_TABLET | Freq: Two times a day (BID) | ORAL | 6 refills | Status: DC

## 2022-07-01 NOTE — Patient Instructions (Signed)
  POSTURAL / ACTION TREMOR  - check MRI brain, TSH  - trial of low dose propranolol 20mg  twice a day (caution with prior fatigue from metoprolol and tizanidine interaction); could consider primidone in future (caution with sedation)

## 2022-07-01 NOTE — Progress Notes (Signed)
[facility]  PATIENT: Vincent Vargas DOB: 01-24-1966  REFERRING CLINICIAN: Bryon Lions, PA-C HISTORY FROM: patient  REASON FOR VISIT: new consult   HISTORICAL  CHIEF COMPLAINT:  Chief Complaint  Patient presents with   Essential tremor    Rm 7 New Pt  "my mom had tremors; I'm not sure if mine are due to depression meds; my hands shake and once my leg was shaking and I couldn't make it stop; I also get occas sharp pain in the back of my head, lasts about 2 minutes"     HISTORY OF PRESENT ILLNESS:   56 year old male here for evaluation of tremor.  Symptoms started about 1 to 2 years ago.  Gradual onset progressive tremor in bilateral upper extremities.  Sometimes feels tremor internally in his legs.  Mother had similar tremors and was treated with deep brain stimulation.     REVIEW OF SYSTEMS: Full 14 system review of systems performed and negative with exception of: as per HPI.  ALLERGIES: Allergies  Allergen Reactions   Varenicline Palpitations    Chest pain   Semaglutide Nausea And Vomiting   Toprol Xl [Metoprolol Tartrate]     fatigue    HOME MEDICATIONS: Outpatient Medications Prior to Visit  Medication Sig Dispense Refill   amLODipine (NORVASC) 5 MG tablet TAKE 1 TABLET BY MOUTH EVERY DAY     atorvastatin (LIPITOR) 10 MG tablet Take 20 mg by mouth.      cloNIDine (CATAPRES) 0.1 MG tablet Take 0.1 mg by mouth 2 (two) times daily.     Continuous Blood Gluc Receiver (FREESTYLE LIBRE READER) DEVI by Does not apply route.     Continuous Blood Gluc Sensor (FREESTYLE LIBRE SENSOR SYSTEM) MISC by Does not apply route.     DULoxetine (CYMBALTA) 30 MG capsule Take 90 mg by mouth daily.     empagliflozin (JARDIANCE) 25 MG TABS tablet Take 25 mg by mouth daily.     glipiZIDE (GLUCOTROL XL) 10 MG 24 hr tablet Take by mouth.     HYDROcodone-acetaminophen (NORCO) 10-325 MG tablet Take 1 tablet by mouth every 6 (six) hours as needed.      lisinopril (ZESTRIL) 5 MG tablet Take 10 mg by mouth.      metFORMIN (GLUCOPHAGE-XR) 500 MG 24 hr tablet Take 1,000 mg by mouth in the morning and at bedtime.      methocarbamol (ROBAXIN) 500 MG tablet Take 1,000 mg by mouth in the morning and at bedtime.     sildenafil (VIAGRA) 100 MG tablet Take 100 mg by mouth daily as needed for erectile dysfunction.     tiZANidine (ZANAFLEX) 4 MG capsule Take 8 mg by mouth at bedtime.     oxyCODONE (OXY IR/ROXICODONE) 5 MG immediate release tablet Take 1 tablet (5 mg total) by mouth every 6 (six) hours as needed for moderate pain, severe pain or breakthrough pain. 28 tablet 0   gabapentin (NEURONTIN) 600 MG tablet Take 1,200 mg by mouth 3 (three) times daily.      No facility-administered medications prior to visit.    PAST MEDICAL HISTORY: Past Medical History:  Diagnosis Date   Abnormal liver enzymes    Chest pain    Chronic pain    Coronary artery disease 2008   nonobstructive ASCAD of RCA by coronayr CTA   CRPS (complex regional pain syndrome type I)    right lower ext   Diabetes (HCC)    Diastolic dysfunction    Fatty liver  Hypercholesteremia    Hypertension    Low back pain    Obesity    OSA (obstructive sleep apnea)    moderate OSA with AHI 20.76/hr does not use CPAP   Pericarditis 2008   Proteinuria    Spinal cord stimulator status    now in abdomen   Tremor     PAST SURGICAL HISTORY: Past Surgical History:  Procedure Laterality Date   ANKLE ARTHROSCOPY Right    ANKLE SURGERY Right 11/2021   ELBOW SURGERY     INGUINAL HERNIA REPAIR Left 05/29/2020   Procedure: OPEN LEFT INGUINAL HERNIA REPAIR WITH MESH;  Surgeon: [name], MD;  Location: [facility];  Service: General;  Laterality: Left;  LMA , TAP BLOCK   INSERTION OF MESH Left 05/29/2020   Procedure: INSERTION OF MESH;  Surgeon: [name], MD;  Location: [facility];  Service: General;  Laterality: Left;   shoulder surgeryx3  Right    SPINAL CORD STIMULATOR IMPLANT  2019   for right ankle    FAMILY HISTORY: Family History  Problem Relation Age of Onset   Lung cancer Mother    Tremor Mother    Heart disease Father    Hypertension Father     SOCIAL HISTORY: Social History   Socioeconomic History   Marital status: Married    Spouse name: Not on file   Number of children: 2   Years of education: Not on file   Highest education level: High school graduate  Occupational History   Not on file  Tobacco Use   Smoking status: Never   Smokeless tobacco: Former    Quit date: 10/10/2007  Vaping Use   Vaping Use: Never used  Substance and Sexual Activity   Alcohol use: No   Drug use: No   Sexual activity: Not on file  Other Topics Concern   Not on file  Social History Narrative   Lives with wife   Social Determinants of Health   Financial Resource Strain: Not on file  Food Insecurity: Not on file  Transportation Needs: Not on file  Physical Activity: Not on file  Stress: Not on file  Social Connections: Not on file  Intimate Partner Violence: Not on file     PHYSICAL EXAM  GENERAL EXAM/CONSTITUTIONAL: Vitals:  Vitals:   07/01/22 0855  BP: 120/82  Pulse: (!) 105  Weight: 170 lb (77.1 kg)  Height: 5\' 9"  (1.753 m)   Body mass index is 25.1 kg/m. Wt Readings from Last 3 Encounters:  07/01/22 170 lb (77.1 kg)  01/28/22 177 lb (80.3 kg)  05/29/20 184 lb 1.4 oz (83.5 kg)   Patient is in no distress; well developed, nourished and groomed; neck is supple  CARDIOVASCULAR: Examination of carotid arteries is normal; no carotid bruits Regular rate and rhythm, no murmurs Examination of peripheral vascular system by observation and palpation is normal  EYES: Ophthalmoscopic exam of optic discs and posterior segments is normal; no papilledema or hemorrhages No results found.  MUSCULOSKELETAL: Gait, strength, tone, movements noted in Neurologic exam below  NEUROLOGIC: MENTAL STATUS:       No data to display         awake, alert, oriented to person, place and time recent and remote memory intact normal attention and concentration language fluent, comprehension intact, naming intact fund of knowledge appropriate  CRANIAL NERVE:  2nd - no papilledema on fundoscopic exam 2nd, 3rd, 4th, 6th - pupils equal and reactive to light, visual fields  full to confrontation, extraocular muscles intact, no nystagmus 5th - facial sensation symmetric 7th - facial strength symmetric 8th - hearing intact 9th - palate elevates symmetrically, uvula midline 11th - shoulder shrug symmetric 12th - tongue protrusion midline  MOTOR:  normal bulk and tone, full strength in the BUE, BLE; MILD POSTURAL TREMOR IN BUE  SENSORY:  normal and symmetric to light touch, temperature, vibration  COORDINATION:  finger-nose-finger, fine finger movements normal  REFLEXES:  deep tendon reflexes present and symmetric  GAIT/STATION:  narrow based gait     DIAGNOSTIC DATA (LABS, IMAGING, TESTING) - I reviewed patient records, labs, notes, testing and imaging myself where available.  Lab Results  Component Value Date   WBC 6.6 06/08/2022   HGB 11.9 (L) 06/08/2022   HCT 35.5 (L) 06/08/2022   MCV 93.7 06/08/2022   PLT 264 06/08/2022      Component Value Date/Time   NA 139 06/08/2022 0116   NA 139 06/27/2019 1435   K 3.9 06/08/2022 0116   CL 102 06/08/2022 0116   CO2 28 06/08/2022 0116   GLUCOSE 159 (H) 06/08/2022 0116   BUN 13 06/08/2022 0116   BUN 10 06/27/2019 1435   CREATININE 0.88 06/08/2022 0116   CALCIUM 9.3 06/08/2022 0116   PROT 7.0 06/08/2022 0116   PROT 7.3 06/27/2019 1435   ALBUMIN 4.3 06/08/2022 0116   ALBUMIN 5.0 (H) 06/27/2019 1435   AST 15 06/08/2022 0116   ALT 23 06/08/2022 0116   ALKPHOS 70 06/08/2022 0116   BILITOT 0.7 06/08/2022 0116   BILITOT 0.7 06/27/2019 1435   GFRNONAA >60 06/08/2022 0116   GFRAA >60 05/25/2020 0900   No results found for:  "CHOL", "HDL", "LDLCALC", "LDLDIRECT", "TRIG", "CHOLHDL" No results found for: "HGBA1C" No results found for: "VITAMINB12" Lab Results  Component Value Date   TSH 1.030 06/27/2019       ASSESSMENT AND PLAN  56 y.o. year old male here with:   Dx:  1. Tremor      PLAN:  POSTURAL / ACTION TREMOR (likely essential tremor) - check MRI brain, TSH to rule out other causes - trial of low dose propranolol 20mg  twice a day (caution with prior fatigue from metoprolol and tizanidine interaction); could consider primidone in future (caution with sedation)  Orders Placed This Encounter  Procedures   MR BRAIN W WO CONTRAST   TSH   Meds ordered this encounter  Medications   propranolol (INDERAL) 20 MG tablet    Sig: Take 1 tablet (20 mg total) by mouth 2 (two) times daily.    Dispense:  60 tablet    Refill:  6   Return in about 6 months (around 01/01/2023) for MyChart visit (15 min).    03/02/2023, MD 07/01/2022, 9:35 AM Certified in Neurology, Neurophysiology and Neuroimaging  [facility] [address] [place], [place] [phone]

## 2022-07-02 LAB — TSH: TSH: 0.926 u[IU]/mL (ref 0.450–4.500)

## 2022-07-03 ENCOUNTER — Telehealth: Payer: Self-pay

## 2022-07-03 NOTE — Telephone Encounter (Signed)
Called pt and notified results were normal. He had no concerns and appreciated the call.

## 2022-07-07 ENCOUNTER — Telehealth: Payer: Self-pay | Admitting: Diagnostic Neuroimaging

## 2022-07-07 NOTE — Telephone Encounter (Signed)
[employer] via website sent to [facility] for pacemaker

## 2022-07-24 NOTE — Telephone Encounter (Signed)
8-15 Per Dr. Chestine Spore he has abandoned leads. Based on the manufacturer MRI safety conditions we are unable to scan devices with abandon leads.

## 2022-08-06 ENCOUNTER — Telehealth: Payer: Self-pay | Admitting: Diagnostic Neuroimaging

## 2022-08-06 DIAGNOSIS — R251 Tremor, unspecified: Secondary | ICD-10-CM

## 2022-08-06 NOTE — Telephone Encounter (Signed)
Pt would like a call re: the status of his MRI being scheduled

## 2022-08-06 NOTE — Telephone Encounter (Signed)
Per note in MRI appointment screen:  8-15 Per Dr. Chestine Spore he has abandoned leads. Based on the manufacturer MRI safety conditions we are unable to scan devices with abandon leads.

## 2022-08-06 NOTE — Telephone Encounter (Signed)
See MRI phone note 07/07/22. Does he need to do a CT scan instead?

## 2022-08-10 NOTE — Addendum Note (Signed)
Addended by: Andrey Spearman R on: 08/10/2022 03:14 PM   Modules accepted: Orders

## 2022-08-10 NOTE — Telephone Encounter (Signed)
MRI cancelled. CT head ordered instead.   Orders Placed This Encounter  Procedures   CT HEAD WO CONTRAST (5MM)   Penni Bombard, MD 4/33/2951, 8:84 PM Certified in Neurology, Neurophysiology and Neuroimaging  [facility] [address], [place], [place] [address] [phone]

## 2022-08-11 NOTE — Telephone Encounter (Signed)
Scheduled at [place]. No auth required

## 2022-09-01 ENCOUNTER — Ambulatory Visit
Admission: RE | Admit: 2022-09-01 | Discharge: 2022-09-01 | Disposition: A | Discharge status: Home / Self Care | Payer: BC Managed Care – PPO | Source: Ambulatory Visit | Attending: Diagnostic Neuroimaging | Admitting: Diagnostic Neuroimaging

## 2022-09-01 DIAGNOSIS — R251 Tremor, unspecified: Secondary | ICD-10-CM

## 2022-12-24 ENCOUNTER — Other Ambulatory Visit: Payer: Self-pay | Admitting: Diagnostic Neuroimaging

## 2023-01-05 ENCOUNTER — Telehealth: Payer: BC Managed Care – PPO | Admitting: Diagnostic Neuroimaging

## 2023-08-21 ENCOUNTER — Other Ambulatory Visit: Payer: Self-pay | Admitting: Diagnostic Neuroimaging

## 2023-09-22 ENCOUNTER — Other Ambulatory Visit: Payer: Self-pay | Admitting: Diagnostic Neuroimaging

## 2023-12-28 ENCOUNTER — Other Ambulatory Visit: Payer: Self-pay | Admitting: Diagnostic Neuroimaging

## 2023-12-28 NOTE — Telephone Encounter (Signed)
Last seen on 07/01/22  No follow up
